# Patient Record
Sex: Female | Born: 1935 | Race: White | Hispanic: No | Smoking: Former smoker
Health system: Southern US, Community
[De-identification: ages and names within clinical notes are randomized; demographics above are authoritative.]

## PROBLEM LIST (undated history)

## (undated) DIAGNOSIS — D649 Anemia, unspecified: Secondary | ICD-10-CM

## (undated) DIAGNOSIS — N189 Chronic kidney disease, unspecified: Secondary | ICD-10-CM

## (undated) DIAGNOSIS — J45909 Unspecified asthma, uncomplicated: Secondary | ICD-10-CM

## (undated) DIAGNOSIS — M069 Rheumatoid arthritis, unspecified: Secondary | ICD-10-CM

## (undated) DIAGNOSIS — R011 Cardiac murmur, unspecified: Secondary | ICD-10-CM

## (undated) DIAGNOSIS — Z5189 Encounter for other specified aftercare: Secondary | ICD-10-CM

## (undated) DIAGNOSIS — H269 Unspecified cataract: Secondary | ICD-10-CM

## (undated) DIAGNOSIS — T7840XA Allergy, unspecified, initial encounter: Secondary | ICD-10-CM

## (undated) DIAGNOSIS — H3530 Unspecified macular degeneration: Secondary | ICD-10-CM

## (undated) DIAGNOSIS — I509 Heart failure, unspecified: Secondary | ICD-10-CM

## (undated) HISTORY — DX: Unspecified cataract: H26.9

## (undated) HISTORY — PX: CATARACT EXTRACTION: SUR2

## (undated) HISTORY — DX: Cardiac murmur, unspecified: R01.1

## (undated) HISTORY — PX: APPENDECTOMY: SHX54

## (undated) HISTORY — DX: Unspecified asthma, uncomplicated: J45.909

## (undated) HISTORY — PX: SPINE SURGERY: SHX786

## (undated) HISTORY — DX: Heart failure, unspecified: I50.9

## (undated) HISTORY — DX: Chronic kidney disease, unspecified: N18.9

## (undated) HISTORY — DX: Anemia, unspecified: D64.9

## (undated) HISTORY — DX: Encounter for other specified aftercare: Z51.89

## (undated) HISTORY — DX: Unspecified macular degeneration: H35.30

## (undated) HISTORY — PX: ABDOMINAL HYSTERECTOMY: SHX81

## (undated) HISTORY — DX: Allergy, unspecified, initial encounter: T78.40XA

## (undated) HISTORY — PX: HIP SURGERY: SHX245

## (undated) HISTORY — PX: TONSILLECTOMY: SUR1361

---

## 2016-10-03 ENCOUNTER — Ambulatory Visit (INDEPENDENT_AMBULATORY_CARE_PROVIDER_SITE_OTHER): Payer: Medicare Other | Admitting: Physician Assistant

## 2016-10-03 ENCOUNTER — Encounter: Payer: Self-pay | Admitting: Physician Assistant

## 2016-10-03 VITALS — BP 137/74 | HR 60 | Temp 97.8°F | Resp 16 | Wt 161.0 lb

## 2016-10-03 DIAGNOSIS — M79605 Pain in left leg: Secondary | ICD-10-CM | POA: Diagnosis not present

## 2016-10-03 DIAGNOSIS — L03116 Cellulitis of left lower limb: Secondary | ICD-10-CM | POA: Diagnosis not present

## 2016-10-03 DIAGNOSIS — I998 Other disorder of circulatory system: Secondary | ICD-10-CM | POA: Diagnosis not present

## 2016-10-03 MED ORDER — DOXYCYCLINE HYCLATE 100 MG PO TABS: 100.0000 mg | ORAL_TABLET | Freq: Two times a day (BID) | ORAL | 0 refills | Status: DC

## 2016-10-03 NOTE — Patient Instructions (Addendum)
Keep legs elevated 3-4 times daily for 30-45 minutes.  Keep clean and dry. Use mild soap and water.  Do not apply compression socks until infection resolves.  Come back and see me in 4 days.   Thank you for coming in today. I hope you feel we met your needs.  Feel free to call PCP if you have any questions or further requests.  Please consider signing up for MyChart if you do not already have it, as this is a great way to communicate with me.  Best,  Whitney McVey, PA-C  IF you received an x-ray today, you will receive an invoice from [facility]. Please contact [facility] at [phone] with questions or concerns regarding your invoice.   IF you received labwork today, you will receive an invoice from [place]. Please contact [facility] at [phone] with questions or concerns regarding your invoice.   Our billing staff will not be able to assist you with questions regarding bills from these companies.  You will be contacted with the lab results as soon as they are available. The fastest way to get your results is to activate your My Chart account. Instructions are located on the last page of this paperwork. If you have not heard from [place] regarding the results in 2 weeks, please contact this office.

## 2016-10-03 NOTE — Progress Notes (Signed)
   Sheila Barnes  MRN: [id number] DOB: 01/13/1928  PCP: Patient, No Pcp Per  Subjective:  Pt is an 83 year old female PMH opiod dependency, kidney disease, asthma, CHF, MI, arthritis, anemia who presents to clinic for lower leg cramping x 1 day. She is here today with her daughter. Pt lives in assisted living facility.  This is not a new problem. She has been battling with swollen LES and skin problems for years. She has been treated before for cellulitis of the LES.  She endorses compliance with elevating her LES however her daughter disagrees.   Review of Systems  Constitutional: Negative for chills, diaphoresis, fatigue and fever.  Respiratory: Negative for cough and shortness of breath.   Cardiovascular: Negative for chest pain and palpitations.    There are no active problems to display for this patient.   No current outpatient prescriptions on file prior to visit.   No current facility-administered medications on file prior to visit.     Allergies  Allergen Reactions  . Nsaids     Pt can't have   . Prednisone     Blood infection     Objective:  BP 137/74   Pulse 60   Temp 97.8 F (36.6 C) (Oral)   Resp 16   Wt 161 lb (73 kg) Comment: per pt, weighed at [facility]  SpO2 95%   Physical Exam  Constitutional: She is oriented to person, place, and time and well-developed, well-nourished, and in no distress. No distress.  She is in a wheelchair  Cardiovascular: Normal rate, regular rhythm and normal heart sounds.   Neurological: She is alert and oriented to person, place, and time. GCS score is 15.  Skin: Skin is warm and dry.  Mild warmth and erythema noted anterior left lower leg. No streaking. No abscess. 2x1 cm ulceration lateral left LES, which is weeping. Culture taken. Distal pulses present b/l. Diffuse skin breakdown superficial dermis b/l ankles and feet. No ulcerations noted between toes.   Psychiatric: Mood, memory, affect and judgment normal.    Vitals reviewed.        Assessment and Plan :  1. Cellulitis of left lower extremity 2. Pain of left lower extremity 3. Vascular insufficiency of extremity - WOUND CULTURE - doxycycline (VIBRA-TABS) 100 MG tablet; Take 1 tablet (100 mg total) by mouth 2 (two) times daily.  Dispense: 20 tablet; Refill: 0 - Culture is pending. RTC in 4 days for recheck.  - Pt has chronic vascular insufficiency with acute infection. H/o noncompliance with compression socks and elevation of LES.  Plan to cover for MRSA due to weeping lesion of left LES. Advised pt to elevate her legs several times a day. RTC in 4 days for recheck.   Marco Collie, PA-C  Primary Care at [facility] 10/03/2016 10:31 AM

## 2016-10-05 LAB — WOUND CULTURE

## 2016-10-07 ENCOUNTER — Encounter: Payer: Self-pay | Admitting: Physician Assistant

## 2016-10-07 ENCOUNTER — Ambulatory Visit (INDEPENDENT_AMBULATORY_CARE_PROVIDER_SITE_OTHER): Payer: Medicare Other | Admitting: Physician Assistant

## 2016-10-07 VITALS — BP 148/69 | HR 50 | Temp 97.5°F | Resp 16

## 2016-10-07 DIAGNOSIS — Z5189 Encounter for other specified aftercare: Secondary | ICD-10-CM

## 2016-10-07 DIAGNOSIS — L03116 Cellulitis of left lower limb: Secondary | ICD-10-CM | POA: Diagnosis not present

## 2016-10-07 MED ORDER — DOUBLE ANTIBIOTIC 500-10000 UNIT/GM EX OINT: 1.0000 "application " | TOPICAL_OINTMENT | Freq: Two times a day (BID) | CUTANEOUS | 0 refills | Status: DC

## 2016-10-07 NOTE — Progress Notes (Signed)
Sheila Barnes  MRN: [id number] DOB: Apr 22, 1927  PCP: Patient, No Pcp Per  Subjective:  Pt is an 83 year old female PMH opiod dependency, kidney disease, asthma, CHF, MI, arthritis, anemia  Who presents to clinic for follow-up leg problems. She is here today with her daughter.  She was here for this problem on 9/18. Treated with Doxycycline. Reports medication compliance, denies side effects. Wound culture shows Staph aureus growth.  Today she reports improvement of symptoms. She has been elevating her legs daily. She lives in an assisted living facility. They are applying daily dressing changes. Wound care will come and see her in 2 days.  Denies fever, chills, n/v, swelling.  This is not a new problem. She has been battling with swollen LES and skin problems for years. She has been treated before for cellulitis of the LES.   Review of Systems  Constitutional: Negative for chills, diaphoresis and fever.  Cardiovascular: Positive for leg swelling.  Musculoskeletal: Negative for arthralgias and joint swelling.  Skin: Positive for wound.    There are no active problems to display for this patient.   Current Outpatient Prescriptions on File Prior to Visit  Medication Sig Dispense Refill  . acetaminophen (TYLENOL) 325 MG tablet Take 650 mg by mouth every 6 (six) hours as needed.    Marland Kitchen aspirin 81 MG tablet Take 81 mg by mouth daily.    . Cholecalciferol (VITAMIN D3) 1000 units CAPS Take by mouth.    . clopidogrel (PLAVIX) 75 MG tablet Take 75 mg by mouth daily.    . Cyanocobalamin (VITAMIN B 12 PO) Take by mouth.    . diclofenac sodium (VOLTAREN) 1 % GEL Apply topically 4 (four) times daily.    . DiphenhydrAMINE HCl (GERI-DRYL PO) Take by mouth.    . docusate sodium (COLACE) 250 MG capsule Take 250 mg by mouth daily.    Marland Kitchen donepezil (ARICEPT) 10 MG tablet Take 10 mg by mouth at bedtime.    Marland Kitchen doxycycline (VIBRA-TABS) 100 MG tablet Take 1 tablet (100 mg total) by mouth 2 (two) times  daily. 20 tablet 0  . ferrous sulfate 325 (65 FE) MG tablet Take 325 mg by mouth daily with breakfast.    . furosemide (LASIX) 40 MG tablet Take 40 mg by mouth.    . gabapentin (NEURONTIN) 100 MG capsule Take 100 mg by mouth 3 (three) times daily.    . isosorbide dinitrate (ISORDIL) 30 MG tablet Take 30 mg by mouth 4 (four) times daily.    Marland Kitchen levothyroxine (SYNTHROID, LEVOTHROID) 75 MCG tablet Take 75 mcg by mouth daily before breakfast.    . lisinopril (PRINIVIL,ZESTRIL) 2.5 MG tablet Take 2.5 mg by mouth daily.    Marland Kitchen loratadine (CLARITIN) 10 MG tablet Take 10 mg by mouth daily.    Marland Kitchen lovastatin (ALTOPREV) 40 MG 24 hr tablet Take 40 mg by mouth at bedtime.    . metoprolol succinate (TOPROL-XL) 25 MG 24 hr tablet Take 25 mg by mouth daily.    . Multiple Vitamins-Minerals (ICAPS MV PO) Take by mouth.    . nitroGLYCERIN (NITROSTAT) 0.4 MG SL tablet Place 0.4 mg under the tongue every 5 (five) minutes as needed for chest pain.    Marland Kitchen nystatin [facility]) powder Apply topically 4 (four) times daily.    . pantoprazole (PROTONIX) 40 MG tablet Take 40 mg by mouth daily.    . Polyethylene Glycol 3350-GRX POWD Take by mouth.    . Polyvinyl Alcohol-Povidone (REFRESH OP) Apply to  eye.    . traMADol (ULTRAM) 50 MG tablet Take by mouth every 6 (six) hours as needed.    . Venlafaxine HCl 150 MG TB24 Take by mouth.     No current facility-administered medications on file prior to visit.     Allergies  Allergen Reactions  . Nsaids     Pt can't have   . Prednisone     Blood infection     Objective:  BP (!) 148/69   Pulse (!) 50   Temp (!) 97.5 F (36.4 C) (Oral)   Resp 16   SpO2 90%   Physical Exam  Constitutional: She is oriented to person, place, and time and well-developed, well-nourished, and in no distress. No distress.  Wheelchair bound  Cardiovascular: Normal rate, regular rhythm and normal heart sounds.   Musculoskeletal:       Left ankle: She exhibits no swelling.       Left lower leg:  She exhibits no edema.  Neurological: She is alert and oriented to person, place, and time. GCS score is 15.  Skin: Skin is warm and dry.  Wound is healing. Improvement of cellulitis and edema. Not warm to touch. She is still moderately TTP. No drainage. Distal pulses present.   Psychiatric: Mood, memory, affect and judgment normal.  Vitals reviewed.      Assessment and Plan :  1. Cellulitis of left lower extremity 2. Encounter for wound re-check - polymixin-bacitracin (POLYSPORIN) [id number] UNIT/GM OINT ointment; Apply 1 application topically 2 (two) times daily.  Dispense: 1 Tube; Refill: 0 - Pt is improving. Wound culture shows staph aureus. Con't antibiotics and dressing changes. Wound care discussed with pt, however she has wound care team coming to see her in 2 days. RTC if symptoms worsen/do not improve.   Marco Collie, PA-C  Primary Care at [facility] 10/07/2016 11:03 AM

## 2016-10-07 NOTE — Patient Instructions (Addendum)
Continue your antibiotics until course completion.  Polysporin ointment. Apply to lowe left leg lesion twice daily. Keep wound clean and dry. Cover with nonadherent dressing until resolution of wound.  RTC if symptoms worsen.   Thank you for coming in today. I hope you feel we met your needs.  Feel free to call PCP if you have any questions or further requests.  Please consider signing up for MyChart if you do not already have it, as this is a great way to communicate with me.  Best,  Whitney McVey, PA-C  IF you received an x-ray today, you will receive an invoice from [facility]. Please contact [facility] at [phone] with questions or concerns regarding your invoice.   IF you received labwork today, you will receive an invoice from [place]. Please contact [facility] at [phone] with questions or concerns regarding your invoice.   Our billing staff will not be able to assist you with questions regarding bills from these companies.  You will be contacted with the lab results as soon as they are available. The fastest way to get your results is to activate your My Chart account. Instructions are located on the last page of this paperwork. If you have not heard from [place] regarding the results in 2 weeks, please contact this office.

## 2016-12-17 ENCOUNTER — Encounter (HOSPITAL_COMMUNITY): Payer: Self-pay

## 2016-12-17 ENCOUNTER — Emergency Department (HOSPITAL_COMMUNITY): Payer: Medicare Other

## 2016-12-17 ENCOUNTER — Inpatient Hospital Stay (HOSPITAL_COMMUNITY)
Admission: EM | Admit: 2016-12-17 | Discharge: 2016-12-20 | DRG: 603 | Disposition: A | Discharge status: Nursing Home (Includes ICF) | Payer: Medicare Other | Attending: Internal Medicine | Admitting: Internal Medicine

## 2016-12-17 DIAGNOSIS — R778 Other specified abnormalities of plasma proteins: Secondary | ICD-10-CM | POA: Diagnosis not present

## 2016-12-17 DIAGNOSIS — E785 Hyperlipidemia, unspecified: Secondary | ICD-10-CM | POA: Diagnosis present

## 2016-12-17 DIAGNOSIS — R609 Edema, unspecified: Secondary | ICD-10-CM

## 2016-12-17 DIAGNOSIS — Z7982 Long term (current) use of aspirin: Secondary | ICD-10-CM

## 2016-12-17 DIAGNOSIS — H3530 Unspecified macular degeneration: Secondary | ICD-10-CM | POA: Diagnosis present

## 2016-12-17 DIAGNOSIS — R001 Bradycardia, unspecified: Secondary | ICD-10-CM | POA: Diagnosis not present

## 2016-12-17 DIAGNOSIS — M7989 Other specified soft tissue disorders: Secondary | ICD-10-CM

## 2016-12-17 DIAGNOSIS — N189 Chronic kidney disease, unspecified: Secondary | ICD-10-CM | POA: Diagnosis present

## 2016-12-17 DIAGNOSIS — I509 Heart failure, unspecified: Secondary | ICD-10-CM | POA: Diagnosis present

## 2016-12-17 DIAGNOSIS — Z7902 Long term (current) use of antithrombotics/antiplatelets: Secondary | ICD-10-CM

## 2016-12-17 DIAGNOSIS — Z87891 Personal history of nicotine dependence: Secondary | ICD-10-CM

## 2016-12-17 DIAGNOSIS — E875 Hyperkalemia: Secondary | ICD-10-CM

## 2016-12-17 DIAGNOSIS — M1711 Unilateral primary osteoarthritis, right knee: Secondary | ICD-10-CM | POA: Diagnosis present

## 2016-12-17 DIAGNOSIS — L03116 Cellulitis of left lower limb: Principal | ICD-10-CM | POA: Diagnosis present

## 2016-12-17 DIAGNOSIS — L0390 Cellulitis, unspecified: Secondary | ICD-10-CM | POA: Diagnosis present

## 2016-12-17 DIAGNOSIS — J45909 Unspecified asthma, uncomplicated: Secondary | ICD-10-CM | POA: Diagnosis present

## 2016-12-17 DIAGNOSIS — Z9889 Other specified postprocedural states: Secondary | ICD-10-CM

## 2016-12-17 DIAGNOSIS — Z79899 Other long term (current) drug therapy: Secondary | ICD-10-CM

## 2016-12-17 DIAGNOSIS — E039 Hypothyroidism, unspecified: Secondary | ICD-10-CM | POA: Diagnosis present

## 2016-12-17 HISTORY — DX: Rheumatoid arthritis, unspecified: M06.9

## 2016-12-17 LAB — CBC WITH DIFFERENTIAL/PLATELET
Basophils Absolute: 0 10*3/uL (ref 0.0–0.1)
Basophils Relative: 0 %
Eosinophils Absolute: 0.4 10*3/uL (ref 0.0–0.7)
Eosinophils Relative: 4 %
HEMATOCRIT: 35 % — AB (ref 36.0–46.0)
HEMOGLOBIN: 11 g/dL — AB (ref 12.0–15.0)
LYMPHS ABS: 1.7 10*3/uL (ref 0.7–4.0)
LYMPHS PCT: 18 %
MCH: 30.8 pg (ref 26.0–34.0)
MCHC: 31.4 g/dL (ref 30.0–36.0)
MCV: 98 fL (ref 78.0–100.0)
MONO ABS: 1.1 10*3/uL — AB (ref 0.1–1.0)
MONOS PCT: 11 %
NEUTROS ABS: 6.3 10*3/uL (ref 1.7–7.7)
Neutrophils Relative %: 67 %
Platelets: 172 10*3/uL (ref 150–400)
RBC: 3.57 MIL/uL — ABNORMAL LOW (ref 3.87–5.11)
RDW: 18.6 % — AB (ref 11.5–15.5)
WBC: 9.5 10*3/uL (ref 4.0–10.5)

## 2016-12-17 LAB — URINALYSIS, ROUTINE W REFLEX MICROSCOPIC
BACTERIA UA: NONE SEEN
Bilirubin Urine: NEGATIVE
Glucose, UA: NEGATIVE mg/dL
Hgb urine dipstick: NEGATIVE
Ketones, ur: NEGATIVE mg/dL
Nitrite: NEGATIVE
PH: 5 (ref 5.0–8.0)
Protein, ur: NEGATIVE mg/dL
SPECIFIC GRAVITY, URINE: 1.013 (ref 1.005–1.030)

## 2016-12-17 LAB — COMPREHENSIVE METABOLIC PANEL
ALBUMIN: 3.3 g/dL — AB (ref 3.5–5.0)
ALK PHOS: 83 U/L (ref 38–126)
ALT: 33 U/L (ref 14–54)
ANION GAP: 9 (ref 5–15)
AST: 31 U/L (ref 15–41)
BUN: 41 mg/dL — ABNORMAL HIGH (ref 6–20)
CALCIUM: 9.1 mg/dL (ref 8.9–10.3)
CHLORIDE: 106 mmol/L (ref 101–111)
CO2: 24 mmol/L (ref 22–32)
Creatinine, Ser: 2.05 mg/dL — ABNORMAL HIGH (ref 0.44–1.00)
GFR calc Af Amer: 24 mL/min — ABNORMAL LOW (ref >60)
GFR calc non Af Amer: 21 mL/min — ABNORMAL LOW (ref >60)
GLUCOSE: 87 mg/dL (ref 65–99)
POTASSIUM: 5.4 mmol/L — AB (ref 3.5–5.1)
SODIUM: 139 mmol/L (ref 135–145)
Total Bilirubin: 0.4 mg/dL (ref 0.3–1.2)
Total Protein: 7.5 g/dL (ref 6.5–8.1)

## 2016-12-17 LAB — I-STAT CG4 LACTIC ACID, ED: LACTIC ACID, VENOUS: 1.69 mmol/L (ref 0.5–1.9)

## 2016-12-17 MED ORDER — TRAMADOL HCL 50 MG PO TABS
50.0000 mg | ORAL_TABLET | Freq: Once | ORAL | Status: AC
  Administered 2016-12-17: 50 mg via ORAL
  Filled 2016-12-17: qty 1

## 2016-12-17 NOTE — ED Provider Notes (Signed)
[facility] EMERGENCY DEPARTMENT Provider Note   CSN: [id number] Arrival date & time: 12/17/16  1636     History   Chief Complaint No chief complaint on file.   HPI Sheila Barnes is a 83 y.o. female.  HPI  This is an 83 year old female with a history of chronic kidney disease, atrial fibrillation, heart failure, asthma who presents with lower extremity pain and swelling.  Patient's daughter provides most of the history.  She has a history of recurrent cellulitis requiring a prolonged course of doxycycline.  This was back in September and October.  She was seen again by her physician at her living facility and has been treated for a wound on her left lower extremity by home health.  The daughter states that today she noted increased swelling bilaterally and bruising of the right lower extremity.  She was seen at urgent care and referred here.  Denies any fevers.  Does report 10 out of 10 pain.  Takes tramadol at baseline.  Patient reportedly fell out of her wheelchair 3 days ago.  She is unable to provide additional history regarding the fall.  Daughters have noted weeping lower extremity edema which is new.  They have also noted blistering of the lower extremities.  Patient denies any chest pain.  She also denies shortness of breath but states "I am wheezing."  No fevers.  She is not currently on any antibiotics.  She does take aspirin and Plavix.  At baseline, patient is in a wheelchair and does transfers.  Past Medical History:  Diagnosis Date  . Allergy   . Anemia   . Arthritis   . Asthma   . Blood transfusion without reported diagnosis   . Cataract   . CHF (congestive heart failure) (HCC)   . Chronic kidney disease   . Heart murmur   . Macular degeneration     Patient Active Problem List   Diagnosis Date Noted  . Cellulitis 12/18/2016    Past Surgical History:  Procedure Laterality Date  . ABDOMINAL HYSTERECTOMY    . SPINE SURGERY      OB History     No data available       Home Medications    Prior to Admission medications   Medication Sig Start Date End Date Taking? Authorizing Provider  acetaminophen (TYLENOL) 325 MG tablet Take 650 mg by mouth at bedtime.    Yes [provider]  acetaminophen (TYLENOL) 325 MG tablet Take 650 mg by mouth every 6 (six) hours as needed for mild pain, moderate pain or fever.   Yes [provider]  alum & mag hydroxide-simeth (GERI-LANTA) 200-200-20 MG/5ML suspension Take 30 mLs by mouth every 6 (six) hours as needed for indigestion or heartburn.   Yes [provider]  aspirin 81 MG tablet Take 81 mg by mouth daily.   Yes [provider]  carboxymethylcellulose (REFRESH TEARS) 0.5 % SOLN Place 1 drop into the left eye every 6 (six) hours as needed (for dry eyes).   Yes [provider]  carboxymethylcellulose (REFRESH TEARS) 0.5 % SOLN Place 1 drop into both eyes daily as needed (for dry eyes).   Yes [provider]  cholecalciferol (VITAMIN D) 1000 units tablet Take 1,000 Units by mouth daily.   Yes [provider]  clopidogrel (PLAVIX) 75 MG tablet Take 75 mg by mouth daily.   Yes [provider]  diclofenac sodium (VOLTAREN) 1 % GEL Apply 4 g topically 3 (three) times  daily.    Yes [provider]  docusate sodium (COLACE) 250 MG capsule Take 250 mg by mouth daily as needed for constipation.    Yes [provider]  donepezil (ARICEPT) 10 MG tablet Take 10 mg by mouth at bedtime.   Yes [provider]  ferrous sulfate 325 (65 FE) MG tablet Take 325 mg by mouth 2 (two) times daily with a meal.    Yes [provider]  furosemide (LASIX) 40 MG tablet Take 20 mg by mouth daily.    Yes [provider]  gabapentin (NEURONTIN) 100 MG capsule Take 200 mg by mouth 3 (three) times daily.    Yes [provider]  isosorbide mononitrate (IMDUR) 30 MG 24 hr tablet Take 30 mg by mouth daily.    Yes [provider]  levothyroxine (SYNTHROID, LEVOTHROID) 75 MCG tablet Take 75 mcg by mouth daily before breakfast.   Yes [provider]  lisinopril (PRINIVIL,ZESTRIL) 2.5 MG tablet Take 2.5 mg by mouth daily.   Yes [provider]  loperamide (IMODIUM A-D) 2 MG tablet Take 2 mg by mouth 4 (four) times daily as needed for diarrhea or loose stools.   Yes [provider]  loratadine (CLARITIN) 10 MG tablet Take 10 mg by mouth daily.   Yes [provider]  lovastatin (ALTOPREV) 40 MG 24 hr tablet Take 40 mg by mouth at bedtime.   Yes [provider]  magnesium hydroxide (MILK OF MAGNESIA) 400 MG/5ML suspension Take 30 mLs by mouth daily as needed for mild constipation.   Yes [provider]  metoprolol tartrate (LOPRESSOR) 25 MG tablet Take 25 mg by mouth 2 (two) times daily.   Yes [provider]  Multiple Vitamins-Minerals (ICAPS MV PO) Take 1 capsule by mouth daily.    Yes [provider]  nitroGLYCERIN (NITROSTAT) 0.4 MG SL tablet Place 0.4 mg under the tongue every 5 (five) minutes as needed for chest pain.   Yes [provider]  nystatin (MYCOSTATIN/NYSTOP) powder Apply topically daily as needed (to rash).   Yes [provider]  pantoprazole (PROTONIX) 40 MG tablet Take 40 mg by mouth daily.   Yes [provider]  polyethylene glycol (MIRALAX / GLYCOLAX) packet Take 17 g by mouth daily as needed for mild constipation.   Yes [provider]  PROAIR HFA 108 (90 Base) MCG/ACT inhaler Inhale 2 puffs into the lungs 2 (two) times daily.  12/13/16  Yes [provider]  traMADol (ULTRAM) 50 MG tablet Take 50 mg by mouth 2 (two) times daily as needed for moderate pain.    Yes [provider]  venlafaxine XR (EFFEXOR-XR) 150 MG 24 hr capsule Take 150 mg by mouth daily with breakfast.   Yes [provider]  vitamin B-12 (CYANOCOBALAMIN) 1000 MCG tablet Take 1,000  mcg by mouth daily.   Yes [provider]  doxycycline (VIBRA-TABS) 100 MG tablet Take 1 tablet (100 mg total) by mouth 2 (two) times daily. Patient not taking: Reported on 12/18/2016 10/03/16   McVey, Madelaine BhatElizabeth Whitney, PA-C  metoprolol succinate (TOPROL-XL) 25 MG 24 hr tablet Take 25 mg by mouth daily.    [provider]  polymixin-bacitracin (POLYSPORIN) 500-10000 UNIT/GM OINT ointment Apply 1 application topically 2 (two) times daily. Patient not taking: Reported on 12/18/2016 10/07/16   McVey, Madelaine BhatElizabeth Whitney, PA-C    Family History History reviewed. No pertinent family history.  Social History Social History   Tobacco Use  . Smoking  status: Former Games developer  . Smokeless tobacco: Never Used  Substance Use Topics  . Alcohol use: No  . Drug use: No     Allergies   Nsaids and Prednisone   Review of Systems Review of Systems  Constitutional: Negative for fever.  Respiratory: Positive for wheezing. Negative for shortness of breath.   Cardiovascular: Negative for chest pain.  Gastrointestinal: Negative for abdominal pain.  Genitourinary: Negative for dysuria.  Musculoskeletal: Positive for back pain.  Skin: Positive for color change and wound.  Neurological: Negative for weakness.  All other systems reviewed and are negative.    Physical Exam Updated Vital Signs BP (!) 146/63   Pulse (!) 49   Temp 97.9 F (36.6 C) (Oral)   Resp 17   Ht 5\' 6"  (1.676 m)   SpO2 97%   BMI 25.99 kg/m   Physical Exam  Constitutional: She is oriented to person, place, and time. No distress.  Elderly, uncomfortable appearing, no acute distress  HENT:  Head: Normocephalic and atraumatic.  Cardiovascular: Normal rate and regular rhythm.  Murmur heard. Pulmonary/Chest: Effort normal. No respiratory distress. She has wheezes.  Faint expiratory wheezing mostly in bilateral upper lung fields  Abdominal: Soft. Bowel sounds are normal. There is no tenderness.    Musculoskeletal:  Weeping 2+ pitting bilateral lower extremity edema right greater than left, additionally patient has blistering over the right lower extremity inferior to the knee, extensive bruising about the knee circumferentially, right foot is cool to touch but sensation is intact, difficult to palpate DP pulse bilaterally Left lower extremity with similar edema, single ulcerated wound approximately 2 cm in diameter over the lateral aspect of the calf, no adjacent erythema or significant warmth  Neurological: She is alert and oriented to person, place, and time.  Skin: Skin is warm and dry.  Psychiatric: She has a normal mood and affect.  Nursing note and vitals reviewed.      ED Treatments / Results  Labs (all labs ordered are listed, but only abnormal results are displayed) Labs Reviewed  COMPREHENSIVE METABOLIC PANEL - Abnormal; Notable for the following components:      Result Value   Potassium 5.4 (*)    BUN 41 (*)    Creatinine, Ser 2.05 (*)    Albumin 3.3 (*)    GFR calc non Af Amer 21 (*)    GFR calc Af Amer 24 (*)    All other components within normal limits  CBC WITH DIFFERENTIAL/PLATELET - Abnormal; Notable for the following components:   RBC 3.57 (*)    Hemoglobin 11.0 (*)    HCT 35.0 (*)    RDW 18.6 (*)    Monocytes Absolute 1.1 (*)    All other components within normal limits  URINALYSIS, ROUTINE W REFLEX MICROSCOPIC - Abnormal; Notable for the following components:   APPearance HAZY (*)    Leukocytes, UA TRACE (*)    Squamous Epithelial / LPF 0-5 (*)    All other components within normal limits  BRAIN NATRIURETIC PEPTIDE - Abnormal; Notable for the following components:   B Natriuretic Peptide 593.8 (*)    All other components within normal limits  DIGOXIN LEVEL  I-STAT CG4 LACTIC ACID, ED    EKG  EKG Interpretation  Date/Time:  Monday December 18 2016 00:28:28 EST Ventricular Rate:  56 PR Interval:    QRS Duration: 108 QT Interval:  446 QTC  Calculation: 431 R Axis:   108 Text Interpretation:  Atrial fibrillation Multiple ventricular premature complexes  Low voltage, extremity and precordial leads Borderline repolarization abnormality Confirmed by Ross Marcus [phone]) on 12/18/2016 12:31:27 AM       Radiology Dg Chest 2 View  Result Date: 12/18/2016 CLINICAL DATA:  83 year old female with fall and right lower extremity pain. EXAM: CHEST  2 VIEW COMPARISON:  None. FINDINGS: There is mild cardiomegaly. There is atherosclerotic calcification of the aortic arch. There is no focal consolidation, pleural effusion, or pneumothorax. Degenerative changes of the shoulders an spine. Age indeterminate, possibly chronic midthoracic compression deformity with anterior wedging. Correlation with clinical exam and point tenderness recommended. IMPRESSION: 1. No acute cardiopulmonary process. 2. Age indeterminate compression deformity of the midthoracic vertebra, possibly chronic. Correlation with point tenderness recommended. Electronically Signed   By: Elgie Collard M.D.   On: 12/18/2016 00:30   Dg Tibia/fibula Right  Result Date: 12/18/2016 CLINICAL DATA:  83 year old female with fall and right lower extremity pain. EXAM: RIGHT TIBIA AND FIBULA - 2 VIEW COMPARISON:  None. FINDINGS: There is severe osteoarthritic changes of the knee with narrowing of the medial and lateral compartments and bone spurring. There is sclerotic changes of the medial tibial plateau. An age indeterminate minimally depressed fracture of the medial tibial plateau is not excluded. Correlation with clinical exam and point tenderness recommended. MRI may provide better evaluation if clinically indicated. No other acute fracture identified. There is no dislocation. The bones are osteopenic. There is mild soft tissue swelling anterior to the proximal tibia. There is atherosclerotic calcification of the vasculature. Mild diffuse subcutaneous edema. IMPRESSION: Severe osteoarthritic  changes of the knee with narrowing of the medial and lateral compartment and possible associated age indeterminate compression of the medial tibial plateau. MRI may provide better evaluation if clinically indicated. No other acute fracture identified. No dislocation. Electronically Signed   By: Elgie Collard M.D.   On: 12/18/2016 00:38    Procedures Procedures (including critical care time)  Medications Ordered in ED Medications  traMADol (ULTRAM) tablet 50 mg (50 mg Oral Given 12/17/16 2340)  furosemide (LASIX) injection 40 mg (40 mg Intravenous Given 12/18/16 0135)     Initial Impression / Assessment and Plan / ED Course  I have reviewed the triage vital signs and the nursing notes.  Pertinent labs & imaging results that were available during my care of the patient were reviewed by me and considered in my medical decision making (see chart for details).     Patient presents with lower extremity weeping and swelling.  She also has evidence of trauma to the right lower leg.  There is no acute indication of infection.  Suspect heart failure in combination with trauma.  DVT is also a consideration.  Patient is otherwise hemodynamically stable.  She is atrial fibrillation on her EKG.  This is not new.  Lab work obtained.  Potassium mildly elevated at 5.4 with a creatinine of 2.  Known history of kidney disease.  Given extent of edema, patient was given 40 mg IV Lasix.  X-ray is equivocal for acute fracture.  It is unclear whether any management would be undertaken if she did have an acute tibial plateau fracture given that she only transfers; however, it may dictate need for more intensive physical therapy.  MRI ordered to evaluate for acute fracture.  Patient also likely needs a DVT study as she is at high risk given trauma and for DVT.  Given overall clinical picture, feel patient would benefit from observation admission for further testing as well as diuresis and physical therapy.  Discussed  with Dr. Selena BattenKim.   Final Clinical Impressions(s) / ED Diagnoses   Final diagnoses:  Acute on chronic congestive heart failure, unspecified heart failure type (HCC)  Swelling of right lower extremity    ED Discharge Orders    None       Shon BatonHorton, Courtney F, MD 12/18/16 [id number]

## 2016-12-17 NOTE — ED Notes (Signed)
Patient transported to X-ray 

## 2016-12-17 NOTE — ED Triage Notes (Signed)
Patient here with daughters who reports recurrent lower leg redness with weeping. Daughter states that she has been treated for cellulitis in past and thinks its the same, no fever, no injury

## 2016-12-18 ENCOUNTER — Inpatient Hospital Stay (HOSPITAL_COMMUNITY): Payer: Medicare Other

## 2016-12-18 ENCOUNTER — Other Ambulatory Visit: Payer: Self-pay

## 2016-12-18 ENCOUNTER — Encounter (HOSPITAL_COMMUNITY): Payer: Self-pay | Admitting: Internal Medicine

## 2016-12-18 DIAGNOSIS — R609 Edema, unspecified: Secondary | ICD-10-CM

## 2016-12-18 DIAGNOSIS — Z87891 Personal history of nicotine dependence: Secondary | ICD-10-CM | POA: Diagnosis not present

## 2016-12-18 DIAGNOSIS — E875 Hyperkalemia: Secondary | ICD-10-CM

## 2016-12-18 DIAGNOSIS — Z7902 Long term (current) use of antithrombotics/antiplatelets: Secondary | ICD-10-CM | POA: Diagnosis not present

## 2016-12-18 DIAGNOSIS — L03116 Cellulitis of left lower limb: Secondary | ICD-10-CM | POA: Diagnosis present

## 2016-12-18 DIAGNOSIS — I361 Nonrheumatic tricuspid (valve) insufficiency: Secondary | ICD-10-CM | POA: Diagnosis not present

## 2016-12-18 DIAGNOSIS — R778 Other specified abnormalities of plasma proteins: Secondary | ICD-10-CM | POA: Diagnosis not present

## 2016-12-18 DIAGNOSIS — L03119 Cellulitis of unspecified part of limb: Secondary | ICD-10-CM

## 2016-12-18 DIAGNOSIS — Z7982 Long term (current) use of aspirin: Secondary | ICD-10-CM | POA: Diagnosis not present

## 2016-12-18 DIAGNOSIS — J45909 Unspecified asthma, uncomplicated: Secondary | ICD-10-CM | POA: Diagnosis present

## 2016-12-18 DIAGNOSIS — M7989 Other specified soft tissue disorders: Secondary | ICD-10-CM | POA: Diagnosis present

## 2016-12-18 DIAGNOSIS — L03115 Cellulitis of right lower limb: Secondary | ICD-10-CM | POA: Diagnosis not present

## 2016-12-18 DIAGNOSIS — I509 Heart failure, unspecified: Secondary | ICD-10-CM | POA: Diagnosis present

## 2016-12-18 DIAGNOSIS — N189 Chronic kidney disease, unspecified: Secondary | ICD-10-CM

## 2016-12-18 DIAGNOSIS — H3530 Unspecified macular degeneration: Secondary | ICD-10-CM | POA: Diagnosis present

## 2016-12-18 DIAGNOSIS — Z79899 Other long term (current) drug therapy: Secondary | ICD-10-CM | POA: Diagnosis not present

## 2016-12-18 DIAGNOSIS — E039 Hypothyroidism, unspecified: Secondary | ICD-10-CM | POA: Diagnosis present

## 2016-12-18 DIAGNOSIS — S8011XA Contusion of right lower leg, initial encounter: Secondary | ICD-10-CM | POA: Diagnosis not present

## 2016-12-18 DIAGNOSIS — M1711 Unilateral primary osteoarthritis, right knee: Secondary | ICD-10-CM | POA: Diagnosis present

## 2016-12-18 DIAGNOSIS — L0390 Cellulitis, unspecified: Secondary | ICD-10-CM | POA: Diagnosis present

## 2016-12-18 DIAGNOSIS — R001 Bradycardia, unspecified: Secondary | ICD-10-CM | POA: Diagnosis not present

## 2016-12-18 DIAGNOSIS — E785 Hyperlipidemia, unspecified: Secondary | ICD-10-CM | POA: Diagnosis present

## 2016-12-18 LAB — TROPONIN I
TROPONIN I: 0.03 ng/mL — AB (ref <0.03)
Troponin I: 0.04 ng/mL (ref <0.03)
Troponin I: 0.09 ng/mL (ref <0.03)

## 2016-12-18 LAB — COMPREHENSIVE METABOLIC PANEL
ALT: 20 U/L (ref 14–54)
AST: 40 U/L (ref 15–41)
Albumin: 3.1 g/dL — ABNORMAL LOW (ref 3.5–5.0)
Alkaline Phosphatase: 96 U/L (ref 38–126)
Anion gap: 10 (ref 5–15)
BUN: 40 mg/dL — AB (ref 6–20)
CHLORIDE: 106 mmol/L (ref 101–111)
CO2: 22 mmol/L (ref 22–32)
CREATININE: 1.86 mg/dL — AB (ref 0.44–1.00)
Calcium: 8.9 mg/dL (ref 8.9–10.3)
GFR calc Af Amer: 27 mL/min — ABNORMAL LOW (ref >60)
GFR calc non Af Amer: 23 mL/min — ABNORMAL LOW (ref >60)
Glucose, Bld: 97 mg/dL (ref 65–99)
Potassium: 4 mmol/L (ref 3.5–5.1)
SODIUM: 138 mmol/L (ref 135–145)
Total Bilirubin: 1.1 mg/dL (ref 0.3–1.2)
Total Protein: 7 g/dL (ref 6.5–8.1)

## 2016-12-18 LAB — CBC
HEMATOCRIT: 34.2 % — AB (ref 36.0–46.0)
Hemoglobin: 10.8 g/dL — ABNORMAL LOW (ref 12.0–15.0)
MCH: 30.3 pg (ref 26.0–34.0)
MCHC: 31.6 g/dL (ref 30.0–36.0)
MCV: 96.1 fL (ref 78.0–100.0)
Platelets: 171 10*3/uL (ref 150–400)
RBC: 3.56 MIL/uL — AB (ref 3.87–5.11)
RDW: 18.3 % — ABNORMAL HIGH (ref 11.5–15.5)
WBC: 9.2 10*3/uL (ref 4.0–10.5)

## 2016-12-18 LAB — TSH: TSH: 6.179 u[IU]/mL — AB (ref 0.350–4.500)

## 2016-12-18 LAB — BRAIN NATRIURETIC PEPTIDE: B Natriuretic Peptide: 593.8 pg/mL — ABNORMAL HIGH (ref 0.0–100.0)

## 2016-12-18 MED ORDER — SODIUM POLYSTYRENE SULFONATE 15 GM/60ML PO SUSP: 15.0000 g | Freq: Once | ORAL | Status: DC

## 2016-12-18 MED ORDER — SODIUM CHLORIDE 0.9% FLUSH: 3.0000 mL | INTRAVENOUS | Status: DC | PRN

## 2016-12-18 MED ORDER — ACETAMINOPHEN 325 MG PO TABS
650.0000 mg | ORAL_TABLET | Freq: Four times a day (QID) | ORAL | Status: DC | PRN
  Administered 2016-12-18: 650 mg via ORAL
  Filled 2016-12-18: qty 2

## 2016-12-18 MED ORDER — TRAMADOL HCL 50 MG PO TABS
50.0000 mg | ORAL_TABLET | Freq: Four times a day (QID) | ORAL | Status: DC | PRN
  Administered 2016-12-18 – 2016-12-20 (×5): 50 mg via ORAL
  Filled 2016-12-18 (×6): qty 1

## 2016-12-18 MED ORDER — LISINOPRIL 5 MG PO TABS
2.5000 mg | ORAL_TABLET | Freq: Every day | ORAL | Status: DC
  Administered 2016-12-18 – 2016-12-20 (×3): 2.5 mg via ORAL
  Filled 2016-12-18 (×3): qty 1

## 2016-12-18 MED ORDER — METOPROLOL TARTRATE 25 MG PO TABS
25.0000 mg | ORAL_TABLET | Freq: Two times a day (BID) | ORAL | Status: DC
Start: 2016-12-18 — End: 2016-12-20
  Administered 2016-12-18 – 2016-12-20 (×5): 25 mg via ORAL
  Filled 2016-12-18 (×5): qty 1

## 2016-12-18 MED ORDER — ASPIRIN 81 MG PO CHEW
81.0000 mg | CHEWABLE_TABLET | Freq: Every day | ORAL | Status: DC
  Administered 2016-12-18 – 2016-12-20 (×3): 81 mg via ORAL
  Filled 2016-12-18 (×3): qty 1

## 2016-12-18 MED ORDER — DEXTROSE 5 % IV SOLN
1.0000 g | INTRAVENOUS | Status: DC
  Administered 2016-12-18: 1 g via INTRAVENOUS
  Filled 2016-12-18: qty 10

## 2016-12-18 MED ORDER — VITAMIN B-12 1000 MCG PO TABS
1000.0000 ug | ORAL_TABLET | Freq: Every day | ORAL | Status: DC
  Administered 2016-12-18 – 2016-12-20 (×3): 1000 ug via ORAL
  Filled 2016-12-18 (×4): qty 1

## 2016-12-18 MED ORDER — DICLOFENAC SODIUM 1 % TD GEL
4.0000 g | Freq: Three times a day (TID) | TRANSDERMAL | Status: DC
  Administered 2016-12-19 – 2016-12-20 (×5): 4 g via TOPICAL
  Filled 2016-12-18 (×2): qty 100

## 2016-12-18 MED ORDER — SODIUM POLYSTYRENE SULFONATE 15 GM/60ML PO SUSP
15.0000 g | Freq: Once | ORAL | Status: AC
  Administered 2016-12-18: 15 g via ORAL
  Filled 2016-12-18: qty 60

## 2016-12-18 MED ORDER — SODIUM CHLORIDE 0.9% FLUSH
3.0000 mL | Freq: Two times a day (BID) | INTRAVENOUS | Status: DC
  Administered 2016-12-18 – 2016-12-20 (×3): 3 mL via INTRAVENOUS

## 2016-12-18 MED ORDER — SODIUM CHLORIDE 0.9 % IV BOLUS (SEPSIS)
250.0000 mL | Freq: Once | INTRAVENOUS | Status: AC
  Administered 2016-12-19: 250 mL via INTRAVENOUS

## 2016-12-18 MED ORDER — LOPERAMIDE HCL 2 MG PO CAPS: 2.0000 mg | ORAL_CAPSULE | Freq: Four times a day (QID) | ORAL | Status: DC | PRN

## 2016-12-18 MED ORDER — ACETAMINOPHEN 325 MG PO TABS
650.0000 mg | ORAL_TABLET | Freq: Every day | ORAL | Status: DC
Start: 2016-12-18 — End: 2016-12-20
  Administered 2016-12-18 – 2016-12-19 (×2): 650 mg via ORAL
  Filled 2016-12-18 (×2): qty 2

## 2016-12-18 MED ORDER — POLYETHYLENE GLYCOL 3350 17 G PO PACK: 17.0000 g | PACK | Freq: Every day | ORAL | Status: DC | PRN

## 2016-12-18 MED ORDER — LOVASTATIN 20 MG PO TABS
40.0000 mg | ORAL_TABLET | Freq: Every day | ORAL | Status: DC
  Filled 2016-12-18 (×2): qty 2

## 2016-12-18 MED ORDER — DOCUSATE SODIUM 50 MG PO CAPS: 250.0000 mg | ORAL_CAPSULE | Freq: Every day | ORAL | Status: DC | PRN

## 2016-12-18 MED ORDER — METHOCARBAMOL 500 MG PO TABS
500.0000 mg | ORAL_TABLET | Freq: Four times a day (QID) | ORAL | Status: DC | PRN
  Administered 2016-12-18 – 2016-12-20 (×4): 500 mg via ORAL
  Filled 2016-12-18 (×4): qty 1

## 2016-12-18 MED ORDER — LEVOTHYROXINE SODIUM 75 MCG PO TABS
75.0000 ug | ORAL_TABLET | Freq: Every day | ORAL | Status: DC
  Administered 2016-12-18 – 2016-12-20 (×3): 75 ug via ORAL
  Filled 2016-12-18 (×3): qty 1

## 2016-12-18 MED ORDER — FUROSEMIDE 20 MG PO TABS
20.0000 mg | ORAL_TABLET | Freq: Every day | ORAL | Status: DC
  Administered 2016-12-18 – 2016-12-20 (×3): 20 mg via ORAL
  Filled 2016-12-18 (×3): qty 1

## 2016-12-18 MED ORDER — CEFAZOLIN SODIUM-DEXTROSE 1-4 GM/50ML-% IV SOLN
1.0000 g | Freq: Two times a day (BID) | INTRAVENOUS | Status: DC
  Administered 2016-12-18 – 2016-12-20 (×5): 1 g via INTRAVENOUS
  Filled 2016-12-18 (×5): qty 50

## 2016-12-18 MED ORDER — GABAPENTIN 100 MG PO CAPS
200.0000 mg | ORAL_CAPSULE | Freq: Three times a day (TID) | ORAL | Status: DC
  Administered 2016-12-18 – 2016-12-20 (×8): 200 mg via ORAL
  Filled 2016-12-18 (×8): qty 2

## 2016-12-18 MED ORDER — ALBUTEROL SULFATE (2.5 MG/3ML) 0.083% IN NEBU
3.0000 mL | INHALATION_SOLUTION | Freq: Two times a day (BID) | RESPIRATORY_TRACT | Status: DC
  Administered 2016-12-18 – 2016-12-19 (×3): 3 mL via RESPIRATORY_TRACT
  Filled 2016-12-18 (×4): qty 3

## 2016-12-18 MED ORDER — ISOSORBIDE MONONITRATE ER 30 MG PO TB24
30.0000 mg | ORAL_TABLET | Freq: Every day | ORAL | Status: DC
  Administered 2016-12-18 – 2016-12-20 (×3): 30 mg via ORAL
  Filled 2016-12-18 (×3): qty 1

## 2016-12-18 MED ORDER — PANTOPRAZOLE SODIUM 40 MG PO TBEC
40.0000 mg | DELAYED_RELEASE_TABLET | Freq: Every day | ORAL | Status: DC
Start: 2016-12-18 — End: 2016-12-20
  Administered 2016-12-18 – 2016-12-20 (×3): 40 mg via ORAL
  Filled 2016-12-18 (×3): qty 1

## 2016-12-18 MED ORDER — SODIUM CHLORIDE 0.9 % IV SOLN: 250.0000 mL | INTRAVENOUS | Status: DC | PRN

## 2016-12-18 MED ORDER — METHOCARBAMOL 500 MG PO TABS: 1000.0000 mg | ORAL_TABLET | Freq: Four times a day (QID) | ORAL | Status: DC | PRN

## 2016-12-18 MED ORDER — VITAMIN D 1000 UNITS PO TABS
1000.0000 [IU] | ORAL_TABLET | Freq: Every day | ORAL | Status: DC
  Administered 2016-12-18 – 2016-12-20 (×3): 1000 [IU] via ORAL
  Filled 2016-12-18 (×4): qty 1

## 2016-12-18 MED ORDER — LORATADINE 10 MG PO TABS
10.0000 mg | ORAL_TABLET | Freq: Every day | ORAL | Status: DC
  Administered 2016-12-18 – 2016-12-20 (×3): 10 mg via ORAL
  Filled 2016-12-18 (×3): qty 1

## 2016-12-18 MED ORDER — FUROSEMIDE 10 MG/ML IJ SOLN
40.0000 mg | Freq: Once | INTRAMUSCULAR | Status: AC
  Administered 2016-12-18: 40 mg via INTRAVENOUS
  Filled 2016-12-18: qty 4

## 2016-12-18 MED ORDER — POLYVINYL ALCOHOL 1.4 % OP SOLN: 1.0000 [drp] | Freq: Every day | OPHTHALMIC | Status: DC | PRN

## 2016-12-18 MED ORDER — PRAVASTATIN SODIUM 40 MG PO TABS
40.0000 mg | ORAL_TABLET | Freq: Every day | ORAL | Status: DC
  Administered 2016-12-18 – 2016-12-19 (×2): 40 mg via ORAL
  Filled 2016-12-18 (×2): qty 1

## 2016-12-18 MED ORDER — CLOPIDOGREL BISULFATE 75 MG PO TABS
75.0000 mg | ORAL_TABLET | Freq: Every day | ORAL | Status: DC
  Administered 2016-12-18 – 2016-12-20 (×3): 75 mg via ORAL
  Filled 2016-12-18 (×3): qty 1

## 2016-12-18 MED ORDER — VENLAFAXINE HCL ER 75 MG PO CP24
150.0000 mg | ORAL_CAPSULE | Freq: Every day | ORAL | Status: DC
  Administered 2016-12-18 – 2016-12-20 (×3): 150 mg via ORAL
  Filled 2016-12-18: qty 1
  Filled 2016-12-18 (×2): qty 2

## 2016-12-18 MED ORDER — FERROUS SULFATE 325 (65 FE) MG PO TABS
325.0000 mg | ORAL_TABLET | Freq: Two times a day (BID) | ORAL | Status: DC
  Administered 2016-12-18 – 2016-12-20 (×6): 325 mg via ORAL
  Filled 2016-12-18 (×7): qty 1

## 2016-12-18 MED ORDER — ENOXAPARIN SODIUM 30 MG/0.3ML ~~LOC~~ SOLN
30.0000 mg | SUBCUTANEOUS | Status: DC
  Administered 2016-12-18 – 2016-12-19 (×2): 30 mg via SUBCUTANEOUS
  Filled 2016-12-18 (×2): qty 0.3

## 2016-12-18 MED ORDER — DONEPEZIL HCL 10 MG PO TABS
10.0000 mg | ORAL_TABLET | Freq: Every day | ORAL | Status: DC
  Administered 2016-12-18 – 2016-12-19 (×2): 10 mg via ORAL
  Filled 2016-12-18 (×2): qty 1

## 2016-12-18 NOTE — Progress Notes (Signed)
Patient admitted after midnight, please see H&P.  Pulses palpable with doppler.  Skin tight, will get ortho consult-- doubt will be able to get MRI as patient not able to keep legs still.  Change abx to cover prior staph infection on left leg.  Marlin CanaryJessica Hazel Leveille DO

## 2016-12-18 NOTE — ED Notes (Signed)
Pt placed on bed pain for BM

## 2016-12-18 NOTE — Progress Notes (Signed)
Patient ID: Artelia LarocheBeverly Whetsell, female   DOB: 25-Dec-1927, 83 y.o.   MRN: [id number]  Re-examined pt this afternoon. No change in subjective assessment of RLE. Compartments still soft, pulses palpable, and sensation intact and normal. Encouraged continued elevation. Will recheck in AM.    Freeman CaldronMichael J. Yianna Tersigni, PA-C Orthopedic Surgery [phone]

## 2016-12-18 NOTE — ED Notes (Signed)
Patient placed in a hospital bed.

## 2016-12-18 NOTE — ED Notes (Signed)
MD able to doppler L dorsalis pedis pulse

## 2016-12-18 NOTE — Consult Note (Signed)
Reason for Consult:R/o compartment syndrome Referring Physician: Princella Ion  Sheila Barnes is an 83 y.o. female with RA, CKD. HPI: Celes fell on Friday. It was unwitnessed but she says she fell on her butt. Her daughter found her. She was checked out by ALF staff and was ok. The daughter noted the appearance of the right leg yesterday and brought her in for evaluation. The patient c/o left leg pain (which is chronic) but denied RLE pain. She has had about 4 falls in the past month. IM was concerned about the appearance of the leg and was worried about compartment syndrome and orthopedic surgery was consulted.  Past Medical History:  Diagnosis Date  . Allergy   . Anemia   . Arthritis   . Asthma   . Blood transfusion without reported diagnosis   . Cataract   . CHF (congestive heart failure) ([place])   . Chronic kidney disease   . Heart murmur   . Macular degeneration     Past Surgical History:  Procedure Laterality Date  . ABDOMINAL HYSTERECTOMY    . APPENDECTOMY    . CATARACT EXTRACTION    . HIP SURGERY    . SPINE SURGERY    . TONSILLECTOMY      Family History  Problem Relation Age of Onset  . Cardiomyopathy Mother   . Heart failure Mother   . CAD Father     Social History:  reports that she has quit smoking. she has never used smokeless tobacco. She reports that she does not drink alcohol or use drugs.  Allergies:  Allergies  Allergen Reactions  . Nsaids     Pt can't have   . Prednisone     Blood infection    Medications: I have reviewed the patient's current medications.  Results for orders placed or performed during the hospital encounter of 12/17/16 (from the past 48 hour(s))  Comprehensive metabolic panel     Status: Abnormal   Collection Time: 12/17/16  4:58 PM  Result Value Ref Range   Sodium 139 135 - 145 mmol/L   Potassium 5.4 (H) 3.5 - 5.1 mmol/L   Chloride 106 101 - 111 mmol/L   CO2 24 22 - 32 mmol/L   Glucose, Bld 87 65 - 99 mg/dL   BUN 41 (H) 6 -  20 mg/dL   Creatinine, Ser 2.05 (H) 0.44 - 1.00 mg/dL   Calcium 9.1 8.9 - 10.3 mg/dL   Total Protein 7.5 6.5 - 8.1 g/dL   Albumin 3.3 (L) 3.5 - 5.0 g/dL   AST 31 15 - 41 U/L   ALT 33 14 - 54 U/L   Alkaline Phosphatase 83 38 - 126 U/L   Total Bilirubin 0.4 0.3 - 1.2 mg/dL   GFR calc non Af Amer 21 (L) >60 mL/min   GFR calc Af Amer 24 (L) >60 mL/min    Comment: (NOTE) The eGFR has been calculated using the CKD EPI equation. This calculation has not been validated in all clinical situations. eGFR's persistently <60 mL/min signify possible Chronic Kidney Disease.    Anion gap 9 5 - 15  CBC with Differential     Status: Abnormal   Collection Time: 12/17/16  4:58 PM  Result Value Ref Range   WBC 9.5 4.0 - 10.5 K/uL   RBC 3.57 (L) 3.87 - 5.11 MIL/uL   Hemoglobin 11.0 (L) 12.0 - 15.0 g/dL   HCT 35.0 (L) 36.0 - 46.0 %   MCV 98.0 78.0 - 100.0 fL  MCH 30.8 26.0 - 34.0 pg   MCHC 31.4 30.0 - 36.0 g/dL   RDW 18.6 (H) 11.5 - 15.5 %   Platelets 172 150 - 400 K/uL   Neutrophils Relative % 67 %   Neutro Abs 6.3 1.7 - 7.7 K/uL   Lymphocytes Relative 18 %   Lymphs Abs 1.7 0.7 - 4.0 K/uL   Monocytes Relative 11 %   Monocytes Absolute 1.1 (H) 0.1 - 1.0 K/uL   Eosinophils Relative 4 %   Eosinophils Absolute 0.4 0.0 - 0.7 K/uL   Basophils Relative 0 %   Basophils Absolute 0.0 0.0 - 0.1 K/uL  I-Stat CG4 Lactic Acid, ED     Status: None   Collection Time: 12/17/16  5:25 PM  Result Value Ref Range   Lactic Acid, Venous 1.69 0.5 - 1.9 mmol/L  Urinalysis, Routine w reflex microscopic     Status: Abnormal   Collection Time: 12/17/16  6:54 PM  Result Value Ref Range   Color, Urine YELLOW YELLOW   APPearance HAZY (A) CLEAR   Specific Gravity, Urine 1.013 1.005 - 1.030   pH 5.0 5.0 - 8.0   Glucose, UA NEGATIVE NEGATIVE mg/dL   Hgb urine dipstick NEGATIVE NEGATIVE   Bilirubin Urine NEGATIVE NEGATIVE   Ketones, ur NEGATIVE NEGATIVE mg/dL   Protein, ur NEGATIVE NEGATIVE mg/dL   Nitrite NEGATIVE  NEGATIVE   Leukocytes, UA TRACE (A) NEGATIVE   RBC / HPF 0-5 0 - 5 RBC/hpf   WBC, UA 0-5 0 - 5 WBC/hpf   Bacteria, UA NONE SEEN NONE SEEN   Squamous Epithelial / LPF 0-5 (A) NONE SEEN  Brain natriuretic peptide     Status: Abnormal   Collection Time: 12/17/16 11:19 PM  Result Value Ref Range   B Natriuretic Peptide 593.8 (H) 0.0 - 100.0 pg/mL  Troponin I (q 6hr x 3)     Status: Abnormal   Collection Time: 12/18/16  5:01 AM  Result Value Ref Range   Troponin I 0.03 (HH) <0.03 ng/mL    Comment: CRITICAL RESULT CALLED TO, READ BACK BY AND VERIFIED WITH: MOON,K RN 12/18/2016 0631 JORDANS   TSH     Status: Abnormal   Collection Time: 12/18/16  5:01 AM  Result Value Ref Range   TSH 6.179 (H) 0.350 - 4.500 uIU/mL    Comment: Performed by a 3rd Generation assay with a functional sensitivity of <=0.01 uIU/mL.  Troponin I (q 6hr x 3)     Status: Abnormal   Collection Time: 12/18/16  8:20 AM  Result Value Ref Range   Troponin I 0.04 (HH) <0.03 ng/mL    Comment: CRITICAL RESULT CALLED TO, READ BACK BY AND VERIFIED WITH: HOPE DOOLEY,RN AT 0914 12/18/16 BY ZBEECH,   Comprehensive metabolic panel     Status: Abnormal   Collection Time: 12/18/16  8:20 AM  Result Value Ref Range   Sodium 138 135 - 145 mmol/L   Potassium 4.0 3.5 - 5.1 mmol/L   Chloride 106 101 - 111 mmol/L   CO2 22 22 - 32 mmol/L   Glucose, Bld 97 65 - 99 mg/dL   BUN 40 (H) 6 - 20 mg/dL   Creatinine, Ser 1.86 (H) 0.44 - 1.00 mg/dL   Calcium 8.9 8.9 - 10.3 mg/dL   Total Protein 7.0 6.5 - 8.1 g/dL   Albumin 3.1 (L) 3.5 - 5.0 g/dL   AST 40 15 - 41 U/L   ALT 20 14 - 54 U/L   Alkaline Phosphatase 96  38 - 126 U/L   Total Bilirubin 1.1 0.3 - 1.2 mg/dL   GFR calc non Af Amer 23 (L) >60 mL/min   GFR calc Af Amer 27 (L) >60 mL/min    Comment: (NOTE) The eGFR has been calculated using the CKD EPI equation. This calculation has not been validated in all clinical situations. eGFR's persistently <60 mL/min signify possible Chronic  Kidney Disease.    Anion gap 10 5 - 15  CBC     Status: Abnormal   Collection Time: 12/18/16  8:20 AM  Result Value Ref Range   WBC 9.2 4.0 - 10.5 K/uL   RBC 3.56 (L) 3.87 - 5.11 MIL/uL   Hemoglobin 10.8 (L) 12.0 - 15.0 g/dL   HCT 34.2 (L) 36.0 - 46.0 %   MCV 96.1 78.0 - 100.0 fL   MCH 30.3 26.0 - 34.0 pg   MCHC 31.6 30.0 - 36.0 g/dL   RDW 18.3 (H) 11.5 - 15.5 %   Platelets 171 150 - 400 K/uL    Dg Chest 2 View  Result Date: 12/18/2016 CLINICAL DATA:  83 year old female with fall and right lower extremity pain. EXAM: CHEST  2 VIEW COMPARISON:  None. FINDINGS: There is mild cardiomegaly. There is atherosclerotic calcification of the aortic arch. There is no focal consolidation, pleural effusion, or pneumothorax. Degenerative changes of the shoulders an spine. Age indeterminate, possibly chronic midthoracic compression deformity with anterior wedging. Correlation with clinical exam and point tenderness recommended. IMPRESSION: 1. No acute cardiopulmonary process. 2. Age indeterminate compression deformity of the midthoracic vertebra, possibly chronic. Correlation with point tenderness recommended. Electronically Signed   By: Anner Crete M.D.   On: 12/18/2016 00:30   Dg Tibia/fibula Right  Result Date: 12/18/2016 CLINICAL DATA:  83 year old female with fall and right lower extremity pain. EXAM: RIGHT TIBIA AND FIBULA - 2 VIEW COMPARISON:  None. FINDINGS: There is severe osteoarthritic changes of the knee with narrowing of the medial and lateral compartments and bone spurring. There is sclerotic changes of the medial tibial plateau. An age indeterminate minimally depressed fracture of the medial tibial plateau is not excluded. Correlation with clinical exam and point tenderness recommended. MRI may provide better evaluation if clinically indicated. No other acute fracture identified. There is no dislocation. The bones are osteopenic. There is mild soft tissue swelling anterior to the  proximal tibia. There is atherosclerotic calcification of the vasculature. Mild diffuse subcutaneous edema. IMPRESSION: Severe osteoarthritic changes of the knee with narrowing of the medial and lateral compartment and possible associated age indeterminate compression of the medial tibial plateau. MRI may provide better evaluation if clinically indicated. No other acute fracture identified. No dislocation. Electronically Signed   By: Anner Crete M.D.   On: 12/18/2016 00:38    Review of Systems  Constitutional: Negative for weight loss.  HENT: Negative for ear discharge, ear pain, hearing loss and tinnitus.   Eyes: Negative for blurred vision, double vision, photophobia and pain.  Respiratory: Negative for cough, sputum production and shortness of breath.   Cardiovascular: Negative for chest pain.  Gastrointestinal: Negative for abdominal pain, nausea and vomiting.  Genitourinary: Negative for dysuria, flank pain, frequency and urgency.  Musculoskeletal: Positive for falls and joint pain (Left leg/back). Negative for back pain, myalgias and neck pain.  Neurological: Negative for dizziness, tingling, sensory change, focal weakness, loss of consciousness and headaches.  Endo/Heme/Allergies: Does not bruise/bleed easily.  Psychiatric/Behavioral: Negative for depression, memory loss and substance abuse. The patient is not nervous/anxious.  Blood pressure (!) 141/96, pulse (!) 122, temperature 98.7 F (37.1 C), temperature source Rectal, resp. rate 15, height _0  (1.676 m), weight 73 kg (160 lb 15 oz), SpO2 95 %. Physical Exam  Constitutional: She appears well-developed and well-nourished. No distress.  HENT:  Head: Normocephalic.  Eyes: Conjunctivae are normal.  Neck: Normal range of motion.  Cardiovascular: Regular rhythm. Frequent extrasystoles are present. Tachycardia present.  Respiratory: Effort normal. No respiratory distress.  Musculoskeletal:  RLE Proximal lower leg ecchymotic  and hyperemic with some areas of weeping  TTP proximal calf  No knee or ankle effusion  Knee stable to varus/ valgus and anterior/posterior stress  Sens DPN, SPN, TN intact  Motor EHL, ext, flex, evers 5/5  DP 2+, PT 2+, 1+ pitting edema, Homan's negative  LLE No traumatic wounds, ecchymosis, some hyperemia proximal lower leg  TTP calf, >RLE  No knee or ankle effusion  Knee stable to varus/ valgus and anterior/posterior stress  Sens DPN, SPN, TN intact  Motor EHL, ext, flex, evers 5/5  DP dopplerable, 1+ pitting edema, >RLE  Neurological: She is alert.  Skin: Skin is warm and dry. She is not diaphoretic.  Psychiatric: She has a normal mood and affect. Her behavior is normal.      Assessment/Plan: RLE ecchymoses, tenderness -- Not particularly concerned about compartment syndrome with length of time from injury, palpable pulses, and lack of sensory changes. Will check on pt this afternoon to make sure there's no changes. Given stable knee exam and lack of tenderness do not think MRI will be of benefit. Will get CT to make sure we are not missing a plateau fx. No weight bearing or activity restrictions from an orthopedic standpoint but should keep leg elevated whenever possible. Dr. Lorin Mercy to evaluate this afternoon.  LLE diminished pulses -- Will add ABI to workup for LLE.    Lisette Abu, PA-C Orthopedic Surgery [phone] 12/18/2016, 10:53 AM        Lorin Mercy note. I reviewed exam , CT , xrays , labs  . Daughter at bedside currently.      Patient has a prepatellar bursa hematoma with extension to pre-tibial tubercle bursa.   Compartments soft, CT neg for Fx but she has tricompartmental OA present.  OK to start weight bearing as tolerated but with venous stasis and soft tissue swelling it may be a day or so before she can tolerate weightbearing on her leg.  Elevation for leg swelling.   My cell [phone]

## 2016-12-18 NOTE — ED Notes (Signed)
Patient transported to CT 

## 2016-12-18 NOTE — Progress Notes (Signed)
Received call from CCMD that patient experienced trigeminal PVC's. Upon assessment pt was sleeping, experienced no symptoms that she was aware of. Notified MD on call, will continue to monitor.

## 2016-12-18 NOTE — ED Notes (Signed)
Notified Dr. Benjamine MolaVann that 2 RN's have been unable to dopler pulse on pt's left foot. Pt's troponin also elevated, denies CP. MD notified. Dr. Benjamine MolaVann coming to bedside

## 2016-12-18 NOTE — Progress Notes (Signed)
  Echocardiogram 2D Echocardiogram has been performed.  Kalany Diekmann T Ahman Dugdale 12/18/2016, 4:17 PM

## 2016-12-18 NOTE — ED Notes (Signed)
Daughter went home and took all patient belongings including her wheelchair

## 2016-12-18 NOTE — H&P (Signed)
TRH H&P   Patient Demographics:    Sheila Barnes, is a 83 y.o. female  MRN: [id number]   DOB - [birth year]  Admit Date - 12/17/2016  Outpatient Primary MD for the patient is Patient, No Pcp Per [facility]  Referring MD/NP/PA:   Ross Marcusourtney Horton  Outpatient Specialists:    Viann FishSpencer Tilley  Patient coming from: [facility]  No chief complaint on file.     HPI:    Sheila Barnes  is a 83 y.o. female, w CKD, (uncertain baseline creatinine, CHF(unknownEF)  , has had cellulitis tx 10/9 w doxycycline at [facility] and then prescribed a 2nd round of doxycycline by PA .  Family noted that she fell on Friday , she was leaning forward to get something and fell forward.  Landed on knee.  Pt notes redness has been getting worse over the past few days.   In ED,  Pt noted to have bruising over the bilateral distal lower ext   CXR IMPRESSION: 1. No acute cardiopulmonary process. 2. Age indeterminate compression deformity of the midthoracic vertebra, possibly chronic. Correlation with point tenderness recommended.   IMPRESSION: Severe osteoarthritic changes of the knee with narrowing of the medial and lateral compartment and possible associated age indeterminate compression of the medial tibial plateau. MRI may provide better evaluation if clinically indicated. No other acute fracture identified. No dislocation.  Na 139, K 5.4,  BUN 41, creatinine 2.05 Wbc 9.5, Hgb 11.0, Plt 172 BNP 593.8  Pt will be admitted for cellulitis, edema.  right knee pain, hyperkalemia   Review of systems:    In addition to the HPI above, No Fever-chills, No Headache, No changes with Vision or hearing, No problems swallowing food or Liquids, No Chest pain, Cough or Shortness of Breath, No Abdominal pain, No Nausea or Vommitting, Bowel movements are  regular, No Blood in stool or Urine, No dysuria, No new skin rashes or bruises, No new joints pains-aches,  No new weakness, tingling, numbness in any extremity, No recent weight gain or loss, No polyuria, polydypsia or polyphagia, No significant Mental Stressors.  A full 10 point Review of Systems was done, except as stated above, all other Review of Systems were negative.   With Past History of the following :    Past Medical History:  Diagnosis Date  . Allergy   . Anemia   . Arthritis   . Asthma   . Blood transfusion without reported diagnosis   . Cataract   . CHF (congestive heart failure) (HCC)   . Chronic kidney disease   . Heart murmur   . Macular degeneration       Past Surgical History:  Procedure Laterality Date  . ABDOMINAL HYSTERECTOMY    . APPENDECTOMY    . CATARACT EXTRACTION    . HIP SURGERY    . SPINE SURGERY    .  TONSILLECTOMY        Social History:     Social History   Tobacco Use  . Smoking status: Former Games developer  . Smokeless tobacco: Never Used  Substance Use Topics  . Alcohol use: No     Lives - at home  Mobility - unclear   Family History :     Family History  Problem Relation Age of Onset  . Cardiomyopathy Mother   . Heart failure Mother   . CAD Father       Home Medications:   Prior to Admission medications   Medication Sig Start Date End Date Taking? Authorizing Provider  acetaminophen (TYLENOL) 325 MG tablet Take 650 mg by mouth at bedtime.    Yes [provider]  acetaminophen (TYLENOL) 325 MG tablet Take 650 mg by mouth every 6 (six) hours as needed for mild pain, moderate pain or fever.   Yes [provider]  alum & mag hydroxide-simeth (GERI-LANTA) 200-200-20 MG/5ML suspension Take 30 mLs by mouth every 6 (six) hours as needed for indigestion or heartburn.   Yes [provider]  aspirin 81 MG tablet Take 81 mg by mouth daily.   Yes [provider]  carboxymethylcellulose  (REFRESH TEARS) 0.5 % SOLN Place 1 drop into the left eye every 6 (six) hours as needed (for dry eyes).   Yes [provider]  carboxymethylcellulose (REFRESH TEARS) 0.5 % SOLN Place 1 drop into both eyes daily as needed (for dry eyes).   Yes [provider]  cholecalciferol (VITAMIN D) 1000 units tablet Take 1,000 Units by mouth daily.   Yes [provider]  clopidogrel (PLAVIX) 75 MG tablet Take 75 mg by mouth daily.   Yes [provider]  diclofenac sodium (VOLTAREN) 1 % GEL Apply 4 g topically 3 (three) times daily.    Yes [provider]  docusate sodium (COLACE) 250 MG capsule Take 250 mg by mouth daily as needed for constipation.    Yes [provider]  donepezil (ARICEPT) 10 MG tablet Take 10 mg by mouth at bedtime.   Yes [provider]  ferrous sulfate 325 (65 FE) MG tablet Take 325 mg by mouth 2 (two) times daily with a meal.    Yes [provider]  furosemide (LASIX) 40 MG tablet Take 20 mg by mouth daily.    Yes [provider]  gabapentin (NEURONTIN) 100 MG capsule Take 200 mg by mouth 3 (three) times daily.    Yes [provider]  isosorbide mononitrate (IMDUR) 30 MG 24 hr tablet Take 30 mg by mouth daily.   Yes [provider]  levothyroxine (SYNTHROID, LEVOTHROID) 75 MCG tablet Take 75 mcg by mouth daily before breakfast.   Yes [provider]  lisinopril (PRINIVIL,ZESTRIL) 2.5 MG tablet Take 2.5 mg by mouth daily.   Yes [provider]  loperamide (IMODIUM A-D) 2 MG tablet Take 2 mg by mouth 4 (four) times daily as needed for diarrhea or loose stools.   Yes [provider]  loratadine (CLARITIN) 10 MG tablet Take 10 mg by mouth daily.   Yes [provider]  lovastatin (ALTOPREV) 40 MG 24 hr tablet Take 40 mg by mouth at bedtime.   Yes [provider]  magnesium hydroxide (MILK OF MAGNESIA) 400 MG/5ML suspension Take 30 mLs by mouth daily as  needed for mild constipation.   Yes [provider]  metoprolol tartrate (LOPRESSOR) 25 MG tablet Take 25 mg by mouth 2 (  two) times daily.   Yes [provider]  Multiple Vitamins-Minerals (ICAPS MV PO) Take 1 capsule by mouth daily.    Yes [provider]  nitroGLYCERIN (NITROSTAT) 0.4 MG SL tablet Place 0.4 mg under the tongue every 5 (five) minutes as needed for chest pain.   Yes [provider]  nystatin (MYCOSTATIN/NYSTOP) powder Apply topically daily as needed (to rash).   Yes [provider]  pantoprazole (PROTONIX) 40 MG tablet Take 40 mg by mouth daily.   Yes [provider]  polyethylene glycol (MIRALAX / GLYCOLAX) packet Take 17 g by mouth daily as needed for mild constipation.   Yes [provider]  PROAIR HFA 108 (90 Base) MCG/ACT inhaler Inhale 2 puffs into the lungs 2 (two) times daily.  12/13/16  Yes [provider]  traMADol (ULTRAM) 50 MG tablet Take 50 mg by mouth 2 (two) times daily as needed for moderate pain.    Yes [provider]  venlafaxine XR (EFFEXOR-XR) 150 MG 24 hr capsule Take 150 mg by mouth daily with breakfast.   Yes [provider]  vitamin B-12 (CYANOCOBALAMIN) 1000 MCG tablet Take 1,000 mcg by mouth daily.   Yes [provider]  doxycycline (VIBRA-TABS) 100 MG tablet Take 1 tablet (100 mg total) by mouth 2 (two) times daily. Patient not taking: Reported on 12/18/2016 10/03/16   McVey, Madelaine BhatElizabeth Whitney, PA-C  metoprolol succinate (TOPROL-XL) 25 MG 24 hr tablet Take 25 mg by mouth daily.    [provider]  polymixin-bacitracin (POLYSPORIN) 500-10000 UNIT/GM OINT ointment Apply 1 application topically 2 (two) times daily. Patient not taking: Reported on 12/18/2016 10/07/16   McVey, Madelaine BhatElizabeth Whitney, PA-C     Allergies:     Allergies  Allergen Reactions  . Nsaids     Pt can't have   . Prednisone     Blood infection     Physical Exam:    Vitals  Blood pressure 118/75, pulse (!) 49, temperature 97.9 F (36.6 C), temperature source Oral, resp. rate 16, height 5\' 6"  (1.676 m), SpO2 97 %.   1. General lying in bed in NAD,   2. Normal affect and insight, Not Suicidal or Homicidal, Awake Alert, Oriented X 3.  3. No F.N deficits, ALL C.Nerves Intact, Strength 5/5 all 4 extremities, Sensation intact all 4 extremities, Plantars down going.  4. Ears and Eyes appear Normal, Conjunctivae clear, PERRLA. Moist Oral Mucosa.  5. Supple Neck, No JVD, No cervical lymphadenopathy appriciated, No Carotid Bruits.  6. Symmetrical Chest wall movement, Good air movement bilaterally, CTAB.  7. RRR, No Gallops, Rubs or Murmurs, No Parasternal Heave.  8. Positive Bowel Sounds, Abdomen Soft, No tenderness, No organomegaly appriciated,No rebound -guarding or rigidity.  9.  No Cyanosis, redness over the bilateral distal lower ext, 1+ edema.  Bruise over the right knee  10. Good muscle tone,  joints appear normal , no effusions, Normal ROM.  11. No Palpable Lymph Nodes in Neck or Axillae     Data Review:    CBC Recent Labs  Lab 12/17/16 1658  WBC 9.5  HGB 11.0*  HCT 35.0*  PLT 172  MCV 98.0  MCH 30.8  MCHC 31.4  RDW 18.6*  LYMPHSABS 1.7  MONOABS 1.1*  EOSABS 0.4  BASOSABS 0.0   ------------------------------------------------------------------------------------------------------------------  Chemistries  Recent Labs  Lab 12/17/16 1658  NA 139  K 5.4*  CL 106  CO2 24  GLUCOSE 87  BUN 41*  CREATININE 2.05*  CALCIUM 9.1  AST 31  ALT 33  ALKPHOS 83  BILITOT 0.4   ------------------------------------------------------------------------------------------------------------------ CrCl cannot be calculated (Unknown ideal weight.). ------------------------------------------------------------------------------------------------------------------ No results for input(s): TSH, T4TOTAL, T3FREE, THYROIDAB in the last 72  hours.  Invalid input(s): FREET3  Coagulation profile No results for input(s): INR, PROTIME in the last 168 hours. ------------------------------------------------------------------------------------------------------------------- No results for input(s): DDIMER in the last 72 hours. -------------------------------------------------------------------------------------------------------------------  Cardiac Enzymes No results for input(s): CKMB, TROPONINI, MYOGLOBIN in the last 168 hours.  Invalid input(s): CK ------------------------------------------------------------------------------------------------------------------    Component Value Date/Time   BNP 593.8 (H) 12/17/2016 2319     ---------------------------------------------------------------------------------------------------------------  Urinalysis    Component Value Date/Time   COLORURINE YELLOW 12/17/2016 1854   APPEARANCEUR HAZY (A) 12/17/2016 1854   LABSPEC 1.013 12/17/2016 1854   PHURINE 5.0 12/17/2016 1854   GLUCOSEU NEGATIVE 12/17/2016 1854   HGBUR NEGATIVE 12/17/2016 1854   BILIRUBINUR NEGATIVE 12/17/2016 1854   KETONESUR NEGATIVE 12/17/2016 1854   PROTEINUR NEGATIVE 12/17/2016 1854   NITRITE NEGATIVE 12/17/2016 1854   LEUKOCYTESUR TRACE (A) 12/17/2016 1854    ----------------------------------------------------------------------------------------------------------------   Imaging Results:    Dg Chest 2 View  Result Date: 12/18/2016 CLINICAL DATA:  83 year old female with fall and right lower extremity pain. EXAM: CHEST  2 VIEW COMPARISON:  None. FINDINGS: There is mild cardiomegaly. There is atherosclerotic calcification of the aortic arch. There is no focal consolidation, pleural effusion, or pneumothorax. Degenerative changes of the shoulders an spine. Age indeterminate, possibly chronic midthoracic compression deformity with anterior wedging. Correlation with clinical exam and point tenderness  recommended. IMPRESSION: 1. No acute cardiopulmonary process. 2. Age indeterminate compression deformity of the midthoracic vertebra, possibly chronic. Correlation with point tenderness recommended. Electronically Signed   By: Elgie Collard M.D.   On: 12/18/2016 00:30   Dg Tibia/fibula Right  Result Date: 12/18/2016 CLINICAL DATA:  83 year old female with fall and right lower extremity pain. EXAM: RIGHT TIBIA AND FIBULA - 2 VIEW COMPARISON:  None. FINDINGS: There is severe osteoarthritic changes of the knee with narrowing of the medial and lateral compartments and bone spurring. There is sclerotic changes of the medial tibial plateau. An age indeterminate minimally depressed fracture of the medial tibial plateau is not excluded. Correlation with clinical exam and point tenderness recommended. MRI may provide better evaluation if clinically indicated. No other acute fracture identified. There is no dislocation. The bones are osteopenic. There is mild soft tissue swelling anterior to the proximal tibia. There is atherosclerotic calcification of the vasculature. Mild diffuse subcutaneous edema. IMPRESSION: Severe osteoarthritic changes of the knee with narrowing of the medial and lateral compartment and possible associated age indeterminate compression of the medial tibial plateau. MRI may provide better evaluation if clinically indicated. No other acute fracture identified. No dislocation. Electronically Signed   By: Elgie Collard M.D.   On: 12/18/2016 00:38      Assessment & Plan:    Active Problems:   Cellulitis    Cellulitis Rocephin 1 gm iv qday  Hyperkalemia Kayexalate 15gm po x1  CKD Check cmp in am Please try to find out her baseline creatinine in am  Bradycardia Tele Trop I q6h x3 HOLD metoprolol  Edema Pt received lasix 40mg  iv x1 in ED Cont lasix 20mg  po qday LE ultrasound r/o DVT Check cardiac echo  Hypothyroidism Cont levothyroxine Check tsh  R knee pain MRI  knee  Hyperlipidemia Cont lovastatin    DVT Prophylaxis Lovenox  AM Labs Ordered, also please review Full Orders  Family Communication: Admission, patients condition  and plan of care including tests being ordered have been discussed with the patient FULL CODE who indicate understanding and agree with the plan and Code Status.  Code Status FULL CODE  Likely DC to  home  Condition GUARDED   Consults called: none  Admission status: inpatient   Time spent in minutes : 45   Pearson Grippe M.D on 12/18/2016 at 2:28 AM  Between 7am to 7pm - Pager - [phone]  . After 7pm go to [email or web address] - password [email or web address]  [facility] - Office  [phone]

## 2016-12-18 NOTE — Progress Notes (Signed)
Received call from CCMD, pt in SVR with HR frequently jumping into 120's. Pt also experienced 1.56 second pause in HR. Upon assessment, pt is sleeping and asymptomatic. Notified MD on call and will continue to monitor.

## 2016-12-18 NOTE — ED Notes (Signed)
Placed purewick on pt and changed.

## 2016-12-18 NOTE — ED Notes (Signed)
Patient resting on hospital bed , states she hurts all over.

## 2016-12-18 NOTE — ED Notes (Signed)
Patient in MRI , introduced myself to family

## 2016-12-18 NOTE — Progress Notes (Signed)
Small elevation of troponin-- echo read pending -with LE bruising would not be a heparin candidate Marlin CanaryJessica Caige Barnes

## 2016-12-19 ENCOUNTER — Inpatient Hospital Stay (HOSPITAL_COMMUNITY): Payer: Medicare Other

## 2016-12-19 DIAGNOSIS — R609 Edema, unspecified: Secondary | ICD-10-CM

## 2016-12-19 DIAGNOSIS — M7989 Other specified soft tissue disorders: Secondary | ICD-10-CM

## 2016-12-19 DIAGNOSIS — E875 Hyperkalemia: Secondary | ICD-10-CM

## 2016-12-19 LAB — TROPONIN I: Troponin I: 0.08 ng/mL (ref <0.03)

## 2016-12-19 LAB — BASIC METABOLIC PANEL
Anion gap: 13 (ref 5–15)
BUN: 37 mg/dL — AB (ref 6–20)
CHLORIDE: 98 mmol/L — AB (ref 101–111)
CO2: 23 mmol/L (ref 22–32)
Calcium: 8.4 mg/dL — ABNORMAL LOW (ref 8.9–10.3)
Creatinine, Ser: 1.96 mg/dL — ABNORMAL HIGH (ref 0.44–1.00)
GFR calc Af Amer: 25 mL/min — ABNORMAL LOW (ref >60)
GFR calc non Af Amer: 22 mL/min — ABNORMAL LOW (ref >60)
GLUCOSE: 86 mg/dL (ref 65–99)
POTASSIUM: 3.5 mmol/L (ref 3.5–5.1)
Sodium: 134 mmol/L — ABNORMAL LOW (ref 135–145)

## 2016-12-19 LAB — ECHOCARDIOGRAM COMPLETE
Height: 66 in
WEIGHTICAEL: 2574.97 [oz_av]

## 2016-12-19 LAB — CBC
HCT: 32.5 % — ABNORMAL LOW (ref 36.0–46.0)
HEMOGLOBIN: 10.3 g/dL — AB (ref 12.0–15.0)
MCH: 30.6 pg (ref 26.0–34.0)
MCHC: 31.7 g/dL (ref 30.0–36.0)
MCV: 96.4 fL (ref 78.0–100.0)
Platelets: 162 10*3/uL (ref 150–400)
RBC: 3.37 MIL/uL — AB (ref 3.87–5.11)
RDW: 18.8 % — ABNORMAL HIGH (ref 11.5–15.5)
WBC: 8.2 10*3/uL (ref 4.0–10.5)

## 2016-12-19 MED ORDER — ALBUTEROL SULFATE (2.5 MG/3ML) 0.083% IN NEBU
3.0000 mL | INHALATION_SOLUTION | Freq: Two times a day (BID) | RESPIRATORY_TRACT | Status: DC
  Administered 2016-12-20: 3 mL via RESPIRATORY_TRACT
  Filled 2016-12-19: qty 3

## 2016-12-19 NOTE — Progress Notes (Signed)
Patient is unable to tolerate even the movement of the imaging probe down the leg for an adequate evaluation. What was able to be visualized in the right thigh appears to be patent. Patient would not allow the test to be continued due to pain even though medication had been given prior to coming to the department. Patient will also not be able to tolerate the cuff pressure for the ABIs. We will place the patient on hold. Please advise if still required if pain can be controlled.

## 2016-12-19 NOTE — Progress Notes (Signed)
Patient ID: Sheila LarocheBeverly Montelongo, female   DOB: 10/02/1927, 83 y.o.   MRN: [id number]   LOS: 1 day   Subjective: No change in BLE symptoms   Objective: Vital signs in last 24 hours: Temp:  [97.9 F (36.6 C)-98.7 F (37.1 C)] 97.9 F (36.6 C) (12/04 0454) Pulse Rate:  [68-130] 70 (12/04 0454) Resp:  [12-19] 18 (12/04 0454) BP: (106-141)/(60-96) 106/60 (12/04 0454) SpO2:  [92 %-98 %] 96 % (12/04 0454) Weight:  [73 kg (160 lb 15 oz)-74.9 kg (165 lb 3.2 oz)] 74.9 kg (165 lb 3.2 oz) (12/04 0454) Last BM Date: 12/18/16   Laboratory  CBC Recent Labs    12/18/16 0820 12/19/16 0418  WBC 9.2 8.2  HGB 10.8* 10.3*  HCT 34.2* 32.5*  PLT 171 162   BMET Recent Labs    12/18/16 0820 12/19/16 0418  NA 138 134*  K 4.0 3.5  CL 106 98*  CO2 22 23  GLUCOSE 97 86  BUN 40* 37*  CREATININE 1.86* 1.96*  CALCIUM 8.9 8.4*     Physical Exam General appearance: alert and no distress  BLE: Warm, palpable pulses RLE, calf swollen but not firm, mild TTP, sensation intact distally   Assessment/Plan: RLE ecchymoses, tenderness -- No signs of compartment syndrome, CT was negative for occult fx. Orthopedics will sign off, please call for questions. LLE diminished pulses -- Awaiting ABI's, will leave attending to f/u    Freeman CaldronMichael J. Jamielee Mchale, PA-C Orthopedic Surgery [phone] 12/19/2016

## 2016-12-19 NOTE — Progress Notes (Signed)
Pt with bigeminal PVC's and nonsustained episode of bradycardia with HR 39. Notified MD. Pt HR now 112, pt resting with no symptoms. Will continue to monitor.

## 2016-12-19 NOTE — Progress Notes (Signed)
PROGRESS NOTE    Sheila Barnes  ZOX:[id number] DOB: 01-23-1927 DOA: 12/17/2016 PCP: Patient, No Pcp Per   Outpatient Specialists:     Brief Narrative:  Sheila Barnes  is a 83 y.o. female, w CKD, (uncertain baseline creatinine, CHF(unknownEF)  , has had cellulitis tx 10/9 w doxycycline at [facility] urgent care and then prescribed a 2nd round of doxycycline by PA .  Family noted that she fell on Friday , she was leaning forward to get something and fell forward.  Landed on knee.  Pt notes redness has been getting worse over the past few days.   Assessment & Plan:   Principal Problem:   Cellulitis Active Problems:   Hyperkalemia   CKD (chronic kidney disease)   Edema   Cellulitis -ancef IV-- change to keflex 250mg  PO BID in AM -duplex LE pending  Hyperkalemia -resolved  CKD -unsure of baseline  Hypothyroidism Cont levothyroxine Tsh: mildly elevated -check free t4  R knee pain MRI knee  Hyperlipidemia Cont lovastatin   PT eval-- in wheelchair-- does do transfers    DVT prophylaxis:  Lovenox   Code Status: Full Code   Family Communication:   Disposition Plan:     Consultants:  Ortho   Subjective: Legs feeling better  Objective: Vitals:   12/18/16 2001 12/18/16 2230 12/19/16 0454 12/19/16 1402  BP:  110/70 106/60 98/62  Pulse: 98 68 70 65  Resp: 18 18 18 18   Temp:  98.5 F (36.9 C) 97.9 F (36.6 C) (!) 97.5 F (36.4 C)  TempSrc:  Oral Oral Oral  SpO2: 94% 95% 96% 95%  Weight:   74.9 kg (165 lb 3.2 oz)   Height:        Intake/Output Summary (Last 24 hours) at 12/19/2016 1416 Last data filed at 12/19/2016 1411 Gross per 24 hour  Intake 690 ml  Output 275 ml  Net 415 ml   Filed Weights   12/18/16 1026 12/19/16 0454  Weight: 73 kg (160 lb 15 oz) 74.9 kg (165 lb 3.2 oz)    Examination:  General exam: NAD  Respiratory system: clear Cardiovascular system: irr at times Gastrointestinal system: +BS, soft Central nervous  system: Alert and oriented Skin: large bruising on right leg with b/l chronic changes-- less weeping in LE     Data Reviewed: I have personally reviewed following labs and imaging studies  CBC: Recent Labs  Lab 12/17/16 1658 12/18/16 0820 12/19/16 0418  WBC 9.5 9.2 8.2  NEUTROABS 6.3  --   --   HGB 11.0* 10.8* 10.3*  HCT 35.0* 34.2* 32.5*  MCV 98.0 96.1 96.4  PLT 172 171 162   Basic Metabolic Panel: Recent Labs  Lab 12/17/16 1658 12/18/16 0820 12/19/16 0418  NA 139 138 134*  K 5.4* 4.0 3.5  CL 106 106 98*  CO2 24 22 23   GLUCOSE 87 97 86  BUN 41* 40* 37*  CREATININE 2.05* 1.86* 1.96*  CALCIUM 9.1 8.9 8.4*   GFR: Estimated Creatinine Clearance: 20.9 mL/min (A) (by C-G formula based on SCr of 1.96 mg/dL (H)). Liver Function Tests: Recent Labs  Lab 12/17/16 1658 12/18/16 0820  AST 31 40  ALT 33 20  ALKPHOS 83 96  BILITOT 0.4 1.1  PROT 7.5 7.0  ALBUMIN 3.3* 3.1*   No results for input(s): LIPASE, AMYLASE in the last 168 hours. No results for input(s): AMMONIA in the last 168 hours. Coagulation Profile: No results for input(s): INR, PROTIME in the last 168 hours. Cardiac Enzymes: Recent  Labs  Lab 12/18/16 0501 12/18/16 0820 12/18/16 1609 12/19/16 0823  TROPONINI 0.03* 0.04* 0.09* 0.08*   BNP (last 3 results) No results for input(s): PROBNP in the last 8760 hours. HbA1C: No results for input(s): HGBA1C in the last 72 hours. CBG: No results for input(s): GLUCAP in the last 168 hours. Lipid Profile: No results for input(s): CHOL, HDL, LDLCALC, TRIG, CHOLHDL, LDLDIRECT in the last 72 hours. Thyroid Function Tests: Recent Labs    12/18/16 0501  TSH 6.179*   Anemia Panel: No results for input(s): VITAMINB12, FOLATE, FERRITIN, TIBC, IRON, RETICCTPCT in the last 72 hours. Urine analysis:    Component Value Date/Time   COLORURINE YELLOW 12/17/2016 1854   APPEARANCEUR HAZY (A) 12/17/2016 1854   LABSPEC 1.013 12/17/2016 1854   PHURINE 5.0 12/17/2016  1854   GLUCOSEU NEGATIVE 12/17/2016 1854   HGBUR NEGATIVE 12/17/2016 1854   BILIRUBINUR NEGATIVE 12/17/2016 1854   KETONESUR NEGATIVE 12/17/2016 1854   PROTEINUR NEGATIVE 12/17/2016 1854   NITRITE NEGATIVE 12/17/2016 1854   LEUKOCYTESUR TRACE (A) 12/17/2016 1854     )No results found for this or any previous visit (from the past 240 hour(s)).    Anti-infectives (From admission, onward)   Start     Dose/Rate Route Frequency Ordered Stop   12/18/16 1100  ceFAZolin (ANCEF) IVPB 1 g/50 mL premix     1 g 100 mL/hr over 30 Minutes Intravenous Every 12 hours 12/18/16 1050     12/18/16 0900  cefTRIAXone (ROCEPHIN) 1 g in dextrose 5 % 50 mL IVPB  Status:  Discontinued     1 g 100 mL/hr over 30 Minutes Intravenous Every 24 hours 12/18/16 0804 12/18/16 1050       Radiology Studies: Dg Chest 2 View  Result Date: 12/18/2016 CLINICAL DATA:  83 year old female with fall and right lower extremity pain. EXAM: CHEST  2 VIEW COMPARISON:  None. FINDINGS: There is mild cardiomegaly. There is atherosclerotic calcification of the aortic arch. There is no focal consolidation, pleural effusion, or pneumothorax. Degenerative changes of the shoulders an spine. Age indeterminate, possibly chronic midthoracic compression deformity with anterior wedging. Correlation with clinical exam and point tenderness recommended. IMPRESSION: 1. No acute cardiopulmonary process. 2. Age indeterminate compression deformity of the midthoracic vertebra, possibly chronic. Correlation with point tenderness recommended. Electronically Signed   By: Elgie CollardArash  Radparvar M.D.   On: 12/18/2016 00:30   Dg Tibia/fibula Right  Result Date: 12/18/2016 CLINICAL DATA:  83 year old female with fall and right lower extremity pain. EXAM: RIGHT TIBIA AND FIBULA - 2 VIEW COMPARISON:  None. FINDINGS: There is severe osteoarthritic changes of the knee with narrowing of the medial and lateral compartments and bone spurring. There is sclerotic changes of  the medial tibial plateau. An age indeterminate minimally depressed fracture of the medial tibial plateau is not excluded. Correlation with clinical exam and point tenderness recommended. MRI may provide better evaluation if clinically indicated. No other acute fracture identified. There is no dislocation. The bones are osteopenic. There is mild soft tissue swelling anterior to the proximal tibia. There is atherosclerotic calcification of the vasculature. Mild diffuse subcutaneous edema. IMPRESSION: Severe osteoarthritic changes of the knee with narrowing of the medial and lateral compartment and possible associated age indeterminate compression of the medial tibial plateau. MRI may provide better evaluation if clinically indicated. No other acute fracture identified. No dislocation. Electronically Signed   By: Elgie CollardArash  Radparvar M.D.   On: 12/18/2016 00:38   Ct Knee Right Wo Contrast  Result Date: 12/18/2016  CLINICAL DATA:  83 year old female fell Saturday and presents with right knee pain and swelling. EXAM: CT OF THE RIGHT KNEE WITHOUT CONTRAST TECHNIQUE: Multidetector CT imaging of the RIGHT knee was performed according to the standard protocol. Multiplanar CT image reconstructions were also generated. COMPARISON:  12/17/2016 tibia and fibula radiographs. FINDINGS: Bones/Joint/Cartilage Motion related artifacts are believed to account for the offset of the distal femoral metadiaphysis on the sagittal reconstructions. No apparent fracture is noted on the coronal nor scout images in this region. If the patient does have pain or focal to the distal femoral shaft, dedicated femoral radiographs may prove useful. Osteoarthritic joint space narrowing of the femorotibial and patellofemoral compartments with subchondral cystic and sclerotic changes more so along the medial femorotibial compartment. Minimal spurring is noted of the tibial plateaus. No suspicious osseous lesions. No joint effusion. Ligaments Suboptimally  assessed by CT. Muscles and Tendons No intramuscular hemorrhage. Mild generalized intramuscular atrophy. Soft tissues Soft tissue contusion along the lateral aspect of the knee. IMPRESSION: 1. Tricompartmental osteoarthritis of the right knee. 2. No lipohemarthrosis nor findings to suggest an acute fracture. 3. On the sagittal reformatted images, there is offset of the distal metadiaphyseal cortex of the femur believed to be due to motion related artifacts. Dedicated femoral radiographs may help confirm that this is due to motion artifact and not a fracture as clinically fell warranted. Electronically Signed   By: Tollie Eth M.D.   On: 12/18/2016 13:22        Scheduled Meds: . acetaminophen  650 mg Oral QHS  . albuterol  3 mL Inhalation BID  . aspirin  81 mg Oral Daily  . cholecalciferol  1,000 Units Oral Daily  . clopidogrel  75 mg Oral Daily  . diclofenac sodium  4 g Topical TID  . donepezil  10 mg Oral QHS  . enoxaparin (LOVENOX) injection  30 mg Subcutaneous Q24H  . ferrous sulfate  325 mg Oral BID WC  . furosemide  20 mg Oral Daily  . gabapentin  200 mg Oral TID  . isosorbide mononitrate  30 mg Oral Daily  . levothyroxine  75 mcg Oral QAC breakfast  . lisinopril  2.5 mg Oral Daily  . loratadine  10 mg Oral Daily  . metoprolol tartrate  25 mg Oral BID  . pantoprazole  40 mg Oral Daily  . pravastatin  40 mg Oral QHS  . sodium chloride flush  3 mL Intravenous Q12H  . venlafaxine XR  150 mg Oral Q breakfast  . vitamin B-12  1,000 mcg Oral Daily   Continuous Infusions: . sodium chloride    .  ceFAZolin (ANCEF) IV Stopped (12/19/16 0926)     LOS: 1 day    Time spent: 35 min    Joseph Art, DO [facility] Pager [phone]  If 7PM-7AM, please contact night-coverage [email or web address] Password [email or web address] 12/19/2016, 2:16 PM

## 2016-12-20 DIAGNOSIS — L03115 Cellulitis of right lower limb: Secondary | ICD-10-CM

## 2016-12-20 LAB — CBC
HEMATOCRIT: 33.6 % — AB (ref 36.0–46.0)
Hemoglobin: 10.6 g/dL — ABNORMAL LOW (ref 12.0–15.0)
MCH: 30.6 pg (ref 26.0–34.0)
MCHC: 31.5 g/dL (ref 30.0–36.0)
MCV: 97.1 fL (ref 78.0–100.0)
PLATELETS: 177 10*3/uL (ref 150–400)
RBC: 3.46 MIL/uL — AB (ref 3.87–5.11)
RDW: 18.9 % — ABNORMAL HIGH (ref 11.5–15.5)
WBC: 7.7 10*3/uL (ref 4.0–10.5)

## 2016-12-20 LAB — BASIC METABOLIC PANEL
Anion gap: 12 (ref 5–15)
BUN: 32 mg/dL — ABNORMAL HIGH (ref 6–20)
CHLORIDE: 99 mmol/L — AB (ref 101–111)
CO2: 25 mmol/L (ref 22–32)
CREATININE: 1.87 mg/dL — AB (ref 0.44–1.00)
Calcium: 8.8 mg/dL — ABNORMAL LOW (ref 8.9–10.3)
GFR, EST AFRICAN AMERICAN: 27 mL/min — AB (ref >60)
GFR, EST NON AFRICAN AMERICAN: 23 mL/min — AB (ref >60)
Glucose, Bld: 92 mg/dL (ref 65–99)
POTASSIUM: 3.9 mmol/L (ref 3.5–5.1)
SODIUM: 136 mmol/L (ref 135–145)

## 2016-12-20 LAB — T4, FREE: Free T4: 0.94 ng/dL (ref 0.61–1.12)

## 2016-12-20 MED ORDER — METHOCARBAMOL 500 MG PO TABS: 500.0000 mg | ORAL_TABLET | Freq: Four times a day (QID) | ORAL | 0 refills | Status: AC | PRN

## 2016-12-20 MED ORDER — CEPHALEXIN 250 MG PO CAPS: 250.0000 mg | ORAL_CAPSULE | Freq: Two times a day (BID) | ORAL | 0 refills | Status: AC

## 2016-12-20 MED ORDER — CEPHALEXIN 250 MG PO CAPS
250.0000 mg | ORAL_CAPSULE | Freq: Two times a day (BID) | ORAL | Status: DC
  Administered 2016-12-20: 250 mg via ORAL
  Filled 2016-12-20 (×2): qty 1

## 2016-12-20 NOTE — Progress Notes (Signed)
[facility] Anticipated DC date: 12/20/16 Family notified: Daughter Transport by: Sharin MonsPTAR   Per MD patient ready for DC to ALF. RN, patient, patient's family, and facility notified of DC. Discharge Summary sent to facility. RN given number for report [phone]). DC packet on chart. Ambulance transport requested for patient.   CSW signing off.  Cristobal GoldmannNadia Dallyn Bergland, [place] Clinical Social Worker [phone]

## 2016-12-20 NOTE — Evaluation (Signed)
Physical Therapy Evaluation Patient Details Name: Sheila Barnes MRN: [id number] DOB: [birth year] Today's Date: 12/20/2016   History of Present Illness  Sheila Barnes  is a 83 y.o. female, w CKD, (uncertain baseline creatinine, CHF(unknownEF)  , has had cellulitis tx 10/9 w doxycycline at [facility] urgent care and then prescribed a 2nd round of doxycycline by PA .  Family noted that she fell on Friday , she was leaning forward to get something and fell forward.  Landed on knee.  Pt notes redness has been getting worse over the past few days.  Clinical Impression  Patient presents with decreased independence/safety with mobility due to painful R LE and generalized weakness.  Feel she will benefit from follow up [place] at d/c, but safe to return to ALF where daughter reports staff can assist.  Feel best for medical transport and daughter in agreement. Will sign off due to likely d/c today.     Follow Up Recommendations Home health PT;Supervision for mobility/OOB    Equipment Recommendations  None recommended by PT    Recommendations for Other Services       Precautions / Restrictions Precautions Precautions: Fall Restrictions Weight Bearing Restrictions: No      Mobility  Bed Mobility Overal bed mobility: Needs Assistance Bed Mobility: Supine to Sit;Sit to Supine     Supine to sit: HOB elevated;Supervision Sit to supine: Supervision   General bed mobility comments: use of rail to come up to sit and increased time.  to supine able to get legs into bed and used rail and bed in trendelenberg to scoot up to [facility]  Transfers Overall transfer level: Needs assistance   Transfers: Stand Pivot Transfers   Stand pivot transfers: Min assist;Mod assist       General transfer comment: used stedy to simulate pole pt uses to transfer at home (stedy backwards so pt could use horizontal bar to pull up and take pivotal steps to [facility] and back to bed) on the way over min A for safety, back to bed  mod A due to pt having to sit prior to getting legs backed up to bed and sat on edge of bed  Ambulation/Gait                Stairs            Wheelchair Mobility    Modified Rankin (Stroke Patients Only)       Balance Overall balance assessment: Needs assistance Sitting-balance support: Feet supported Sitting balance-Leahy Scale: Fair Sitting balance - Comments: static sitting without UE support, but reports has fallen when reaching from w/c for items and at times w/c not locked   Standing balance support: Bilateral upper extremity supported Standing balance-Leahy Scale: Poor Standing balance comment: holding onto something whenever up on her feet                             Pertinent Vitals/Pain Pain Assessment: Faces Faces Pain Scale: Hurts whole lot Pain Location: R leg after mobilizing Pain Descriptors / Indicators: Aching;Sore;Tender Pain Intervention(s): Repositioned;Monitored during session;Patient requesting pain meds-RN notified    Home Living Family/patient expects to be discharged to:: Assisted living([name] [place])               Home Equipment: Wheelchair - manual;Other (comment)(transfer pole)      Prior Function Level of Independence: Needs assistance;Independent with assistive device(s)   Gait / Transfers Assistance Needed: transfers to wheelchair  and bed with pole on her own; propels w/c with arms and feet  ADL's / Homemaking Assistance Needed: assist for some ADL's        Hand Dominance        Extremity/Trunk Assessment        Lower Extremity Assessment Lower Extremity Assessment: RLE deficits/detail;LLE deficits/detail RLE Deficits / Details: knee flexion contractures due to arthritis per daughter about 30 degrees bilaterally; R LE with extensive ecchymosis and sores on leg from fall and cellulitis; audible crepitus in knees when transferring LLE Deficits / Details: knee flexion contractures due  to arthritis per daughter about 30 degrees bilaterally    Cervical / Trunk Assessment Cervical / Trunk Assessment: Kyphotic;Other exceptions Cervical / Trunk Exceptions: kyphoscoliosis  Communication   Communication: No difficulties  Cognition Arousal/Alertness: Awake/alert Behavior During Therapy: WFL for tasks assessed/performed Overall Cognitive Status: History of cognitive impairments - at baseline                                        General Comments General comments (skin integrity, edema, etc.): daughter in room and eager for pt to return to [facility] as she feels she maintains independence better there with set up in her room with transfer pole.  Discussed need for staff assist any time getting up to w/c or to [facility] or going back to bed.  Also discussed HHPT.    Exercises     Assessment/Plan    PT Assessment All further PT needs can be met in the next venue of care  PT Problem List         PT Treatment Interventions      PT Goals (Current goals can be found in the Care Plan section)  Acute Rehab PT Goals PT Goal Formulation: All assessment and education complete, DC therapy    Frequency     Barriers to discharge        Co-evaluation               AM-PAC PT "6 Clicks" Daily Activity  Outcome Measure                  End of Session   Activity Tolerance: Patient limited by fatigue Patient left: in bed;with call bell/phone within reach;with family/visitor present Nurse Communication: Patient requests pain meds PT Visit Diagnosis: Muscle weakness (generalized) (M62.81);History of falling (Z91.81);Other symptoms and signs involving the nervous system (R29.898)    Time: 7341-9379 PT Time Calculation (min) (ACUTE ONLY): 45 min   Charges:   PT Evaluation $PT Eval Moderate Complexity: 1 Mod PT Treatments $Therapeutic Activity: 23-37 mins   PT G CodesMagda Barnes, [place] [phone] 12/20/2016   Sheila Barnes 12/20/2016, 1:34 PM

## 2016-12-20 NOTE — Discharge Summary (Signed)
Physician Discharge Summary  Chrishonda Hesch ZOX:[id number] DOB: [birth year] DOA: 12/17/2016  PCP: Patient, No Pcp Per  Admit date: 12/17/2016 Discharge date: 12/20/2016   Recommendations for Outpatient Follow-Up:   1. Daughter to establish with PCP 2. Keflex  3. Elevated LE 4. Home health 5. Consider palliative care referral-- overall poor prognosis   Discharge Diagnosis:   Principal Problem:   Cellulitis Active Problems:   Hyperkalemia   CKD (chronic kidney disease)   Edema   Discharge disposition:  ALF  Discharge Condition: Improved.  Diet recommendation: Low sodium, heart healthy.  Carbohydrate-modified.  Regular.  Wound care: None.   History of Present Illness:   BeverlyHandleyis a83 y.o.female,w CKD, (uncertain baseline creatinine, CHF(unknownEF) , has had cellulitis tx 10/9 w doxycycline at [facility] urgent care and then prescribed a 2nd round of doxycycline by PA . Family noted that she fell on Friday , she was leaning forward to get something and fell forward. Landed on knee. Pt notes redness has been getting worse over the past few days.     Hospital Course by Problem:   Cellulitis -ancef IV-- change to keflex 250mg  PO BID in AM -duplex LE unable to have full scan-- what was seen did not have blood clots -patient sits in wheelchair with legs dangling-- needs to elevate extremities Unable to do ABIs due to pain -ortho- WBAT  Hyperkalemia -resolved  CKD -unsure of baseline -continue lasix  Hypothyroidism Cont levothyroxine Tsh: mildly elevated -free t4 normal  Hyperlipidemia Cont lovastatin  PT eval-- in wheelchair-- does do transfers-- home health      Medical Consultants:    ortho   Discharge Exam:   Vitals:   12/20/16 0812 12/20/16 0943  BP:  113/75  Pulse:  60  Resp:    Temp:    SpO2: 99%    Vitals:   12/20/16 0459 12/20/16 0504 12/20/16 0812 12/20/16 0943  BP:  138/84  113/75  Pulse:  64  60  Resp:   17    Temp:  (!) 97.5 F (36.4 C)    TempSrc:  Oral    SpO2:  98% 99%   Weight: 80.7 kg (178 lb)     Height:           The results of significant diagnostics from this hospitalization (including imaging, microbiology, ancillary and laboratory) are listed below for reference.     Procedures and Diagnostic Studies:   Dg Chest 2 View  Result Date: 12/18/2016 CLINICAL DATA:  83 year old female with fall and right lower extremity pain. EXAM: CHEST  2 VIEW COMPARISON:  None. FINDINGS: There is mild cardiomegaly. There is atherosclerotic calcification of the aortic arch. There is no focal consolidation, pleural effusion, or pneumothorax. Degenerative changes of the shoulders an spine. Age indeterminate, possibly chronic midthoracic compression deformity with anterior wedging. Correlation with clinical exam and point tenderness recommended. IMPRESSION: 1. No acute cardiopulmonary process. 2. Age indeterminate compression deformity of the midthoracic vertebra, possibly chronic. Correlation with point tenderness recommended. Electronically Signed   By: Elgie Collard M.D.   On: 12/18/2016 00:30   Dg Tibia/fibula Right  Result Date: 12/18/2016 CLINICAL DATA:  83 year old female with fall and right lower extremity pain. EXAM: RIGHT TIBIA AND FIBULA - 2 VIEW COMPARISON:  None. FINDINGS: There is severe osteoarthritic changes of the knee with narrowing of the medial and lateral compartments and bone spurring. There is sclerotic changes of the medial tibial plateau. An age indeterminate minimally depressed fracture of the medial tibial plateau is  not excluded. Correlation with clinical exam and point tenderness recommended. MRI may provide better evaluation if clinically indicated. No other acute fracture identified. There is no dislocation. The bones are osteopenic. There is mild soft tissue swelling anterior to the proximal tibia. There is atherosclerotic calcification of the vasculature. Mild diffuse  subcutaneous edema. IMPRESSION: Severe osteoarthritic changes of the knee with narrowing of the medial and lateral compartment and possible associated age indeterminate compression of the medial tibial plateau. MRI may provide better evaluation if clinically indicated. No other acute fracture identified. No dislocation. Electronically Signed   By: Elgie Collard M.D.   On: 12/18/2016 00:38   Ct Knee Right Wo Contrast  Result Date: 12/18/2016 CLINICAL DATA:  83 year old female fell Saturday and presents with right knee pain and swelling. EXAM: CT OF THE RIGHT KNEE WITHOUT CONTRAST TECHNIQUE: Multidetector CT imaging of the RIGHT knee was performed according to the standard protocol. Multiplanar CT image reconstructions were also generated. COMPARISON:  12/17/2016 tibia and fibula radiographs. FINDINGS: Bones/Joint/Cartilage Motion related artifacts are believed to account for the offset of the distal femoral metadiaphysis on the sagittal reconstructions. No apparent fracture is noted on the coronal nor scout images in this region. If the patient does have pain or focal to the distal femoral shaft, dedicated femoral radiographs may prove useful. Osteoarthritic joint space narrowing of the femorotibial and patellofemoral compartments with subchondral cystic and sclerotic changes more so along the medial femorotibial compartment. Minimal spurring is noted of the tibial plateaus. No suspicious osseous lesions. No joint effusion. Ligaments Suboptimally assessed by CT. Muscles and Tendons No intramuscular hemorrhage. Mild generalized intramuscular atrophy. Soft tissues Soft tissue contusion along the lateral aspect of the knee. IMPRESSION: 1. Tricompartmental osteoarthritis of the right knee. 2. No lipohemarthrosis nor findings to suggest an acute fracture. 3. On the sagittal reformatted images, there is offset of the distal metadiaphyseal cortex of the femur believed to be due to motion related artifacts. Dedicated  femoral radiographs may help confirm that this is due to motion artifact and not a fracture as clinically fell warranted. Electronically Signed   By: Tollie Eth M.D.   On: 12/18/2016 13:22     Labs:   Basic Metabolic Panel: Recent Labs  Lab 12/17/16 1658 12/18/16 0820 12/19/16 0418 12/20/16 0507  NA 139 138 134* 136  K 5.4* 4.0 3.5 3.9  CL 106 106 98* 99*  CO2 24 22 23 25   GLUCOSE 87 97 86 92  BUN 41* 40* 37* 32*  CREATININE 2.05* 1.86* 1.96* 1.87*  CALCIUM 9.1 8.9 8.4* 8.8*   GFR Estimated Creatinine Clearance: 22.7 mL/min (A) (by C-G formula based on SCr of 1.87 mg/dL (H)). Liver Function Tests: Recent Labs  Lab 12/17/16 1658 12/18/16 0820  AST 31 40  ALT 33 20  ALKPHOS 83 96  BILITOT 0.4 1.1  PROT 7.5 7.0  ALBUMIN 3.3* 3.1*   No results for input(s): LIPASE, AMYLASE in the last 168 hours. No results for input(s): AMMONIA in the last 168 hours. Coagulation profile No results for input(s): INR, PROTIME in the last 168 hours.  CBC: Recent Labs  Lab 12/17/16 1658 12/18/16 0820 12/19/16 0418 12/20/16 0507  WBC 9.5 9.2 8.2 7.7  NEUTROABS 6.3  --   --   --   HGB 11.0* 10.8* 10.3* 10.6*  HCT 35.0* 34.2* 32.5* 33.6*  MCV 98.0 96.1 96.4 97.1  PLT 172 171 162 177   Cardiac Enzymes: Recent Labs  Lab 12/18/16 0501 12/18/16 0820 12/18/16 1609  12/19/16 0823  TROPONINI 0.03* 0.04* 0.09* 0.08*   BNP: Invalid input(s): POCBNP CBG: No results for input(s): GLUCAP in the last 168 hours. D-Dimer No results for input(s): DDIMER in the last 72 hours. Hgb A1c No results for input(s): HGBA1C in the last 72 hours. Lipid Profile No results for input(s): CHOL, HDL, LDLCALC, TRIG, CHOLHDL, LDLDIRECT in the last 72 hours. Thyroid function studies Recent Labs    12/18/16 0501  TSH 6.179*   Anemia work up No results for input(s): VITAMINB12, FOLATE, FERRITIN, TIBC, IRON, RETICCTPCT in the last 72 hours. Microbiology No results found for this or any previous  visit (from the past 240 hour(s)).   Discharge Instructions:    Allergies as of 12/20/2016      Reactions   Nsaids    Pt can't have   Prednisone    Blood infection      Medication List    STOP taking these medications   doxycycline 100 MG tablet Commonly known as:  VIBRA-TABS   metoprolol succinate 25 MG 24 hr tablet Commonly known as:  TOPROL-XL   polymixin-bacitracin 500-10000 UNIT/GM Oint ointment     TAKE these medications   acetaminophen 325 MG tablet Commonly known as:  TYLENOL Take 650 mg by mouth at bedtime.   acetaminophen 325 MG tablet Commonly known as:  TYLENOL Take 650 mg by mouth every 6 (six) hours as needed for mild pain, moderate pain or fever.   aspirin 81 MG tablet Take 81 mg by mouth daily.   cephALEXin 250 MG capsule Commonly known as:  KEFLEX Take 1 capsule (250 mg total) by mouth every 12 (twelve) hours.   cholecalciferol 1000 units tablet Commonly known as:  VITAMIN D Take 1,000 Units by mouth daily.   clopidogrel 75 MG tablet Commonly known as:  PLAVIX Take 75 mg by mouth daily.   diclofenac sodium 1 % Gel Commonly known as:  VOLTAREN Apply 4 g topically 3 (three) times daily.   docusate sodium 250 MG capsule Commonly known as:  COLACE Take 250 mg by mouth daily as needed for constipation.   donepezil 10 MG tablet Commonly known as:  ARICEPT Take 10 mg by mouth at bedtime.   ferrous sulfate 325 (65 FE) MG tablet Take 325 mg by mouth 2 (two) times daily with a meal.   furosemide 40 MG tablet Commonly known as:  LASIX Take 20 mg by mouth daily.   gabapentin 100 MG capsule Commonly known as:  NEURONTIN Take 200 mg by mouth 3 (three) times daily.   GERI-LANTA 200-200-20 MG/5ML suspension Generic drug:  alum & mag hydroxide-simeth Take 30 mLs by mouth every 6 (six) hours as needed for indigestion or heartburn.   ICAPS MV PO Take 1 capsule by mouth daily.   isosorbide mononitrate 30 MG 24 hr tablet Commonly known as:   IMDUR Take 30 mg by mouth daily.   levothyroxine 75 MCG tablet Commonly known as:  SYNTHROID, LEVOTHROID Take 75 mcg by mouth daily before breakfast.   lisinopril 2.5 MG tablet Commonly known as:  PRINIVIL,ZESTRIL Take 2.5 mg by mouth daily.   loperamide 2 MG tablet Commonly known as:  IMODIUM A-D Take 2 mg by mouth 4 (four) times daily as needed for diarrhea or loose stools.   loratadine 10 MG tablet Commonly known as:  CLARITIN Take 10 mg by mouth daily.   lovastatin 40 MG 24 hr tablet Commonly known as:  ALTOPREV Take 40 mg by mouth at bedtime.  magnesium hydroxide 400 MG/5ML suspension Commonly known as:  MILK OF MAGNESIA Take 30 mLs by mouth daily as needed for mild constipation.   methocarbamol 500 MG tablet Commonly known as:  ROBAXIN Take 1 tablet (500 mg total) by mouth every 6 (six) hours as needed for muscle spasms.   metoprolol tartrate 25 MG tablet Commonly known as:  LOPRESSOR Take 25 mg by mouth 2 (two) times daily.   nitroGLYCERIN 0.4 MG SL tablet Commonly known as:  NITROSTAT Place 0.4 mg under the tongue every 5 (five) minutes as needed for chest pain.   nystatin powder Commonly known as:  MYCOSTATIN/NYSTOP Apply topically daily as needed (to rash).   pantoprazole 40 MG tablet Commonly known as:  PROTONIX Take 40 mg by mouth daily.   polyethylene glycol packet Commonly known as:  MIRALAX / GLYCOLAX Take 17 g by mouth daily as needed for mild constipation.   PROAIR HFA 108 (90 Base) MCG/ACT inhaler Generic drug:  albuterol Inhale 2 puffs into the lungs 2 (two) times daily.   REFRESH TEARS 0.5 % Soln Generic drug:  carboxymethylcellulose Place 1 drop into both eyes daily as needed (for dry eyes). What changed:  Another medication with the same name was removed. Continue taking this medication, and follow the directions you see here.   traMADol 50 MG tablet Commonly known as:  ULTRAM Take 50 mg by mouth 2 (two) times daily as needed for  moderate pain.   venlafaxine XR 150 MG 24 hr capsule Commonly known as:  EFFEXOR-XR Take 150 mg by mouth daily with breakfast.   vitamin B-12 1000 MCG tablet Commonly known as:  CYANOCOBALAMIN Take 1,000 mcg by mouth daily.         Time coordinating discharge: 35 min  Signed:  Joseph ArtJessica U Starlina Lapre   [facility] 12/20/2016, 10:23 AM

## 2016-12-20 NOTE — Discharge Instructions (Signed)
Keep LE elevated

## 2016-12-20 NOTE — NC FL2 (Signed)
[place] MEDICAID FL2 LEVEL OF CARE SCREENING TOOL     IDENTIFICATION  Patient Name: Sheila Barnes Birthdate: 09/24/1927 Sex: female Admission Date (Current Location): 12/17/2016  [facility] [place]   Facility and Address:  [facility] [place], [address], [place], [place] [address]      Provider Number: [id number]  Attending Physician Name and Address:  Sheila Barnes, Sheila Barnes  Relative Name and Phone Number:  [place], daughter, [phone]    Current Level of Care: Hospital Recommended Level of Care: Assisted Living Facility Prior Approval Number:    Date Approved/Denied:   PASRR Number:    Discharge Plan: Other (Comment)(ALF)    Current Diagnoses: Patient Active Problem List   Diagnosis Date Noted  . Cellulitis 12/18/2016  . Hyperkalemia 12/18/2016  . CKD (chronic kidney disease) 12/18/2016  . Edema 12/18/2016    Orientation RESPIRATION BLADDER Height & Weight     Self, Place  Normal Incontinent Weight: 80.7 kg (178 lb)(taken on bed) Height:  5\' 6"  (167.6 cm)  BEHAVIORAL SYMPTOMS/MOOD NEUROLOGICAL BOWEL NUTRITION STATUS      Continent Diet(Regular)  AMBULATORY STATUS COMMUNICATION OF NEEDS Skin   Limited Assist Verbally Normal                       Personal Care Assistance Level of Assistance  Bathing, Feeding, Dressing Bathing Assistance: Maximum assistance Feeding assistance: Independent Dressing Assistance: Limited assistance     Functional Limitations Info             SPECIAL CARE FACTORS FREQUENCY  PT (By licensed PT)     PT Frequency: 5x/week              Contractures      Additional Factors Info  Code Status, Allergies, Psychotropic Code Status Info: Full Allergies Info: Nsaids, Prednisone Psychotropic Info: Effexor         Current Medications (12/20/2016):    Discharge Medications: STOP taking these medications   doxycycline 100 MG tablet Commonly known as:  VIBRA-TABS    metoprolol succinate 25 MG 24 hr tablet Commonly known as:  TOPROL-XL   polymixin-bacitracin 500-10000 UNIT/GM Oint ointment     TAKE these medications   acetaminophen 325 MG tablet Commonly known as:  TYLENOL Take 650 mg by mouth at bedtime.   acetaminophen 325 MG tablet Commonly known as:  TYLENOL Take 650 mg by mouth every 6 (six) hours as needed for mild pain, moderate pain or fever.   aspirin 81 MG tablet Take 81 mg by mouth daily.   cephALEXin 250 MG capsule Commonly known as:  KEFLEX Take 1 capsule (250 mg total) by mouth every 12 (twelve) hours.   cholecalciferol 1000 units tablet Commonly known as:  VITAMIN D Take 1,000 Units by mouth daily.   clopidogrel 75 MG tablet Commonly known as:  PLAVIX Take 75 mg by mouth daily.   diclofenac sodium 1 % Gel Commonly known as:  VOLTAREN Apply 4 g topically 3 (three) times daily.   docusate sodium 250 MG capsule Commonly known as:  COLACE Take 250 mg by mouth daily as needed for constipation.   donepezil 10 MG tablet Commonly known as:  ARICEPT Take 10 mg by mouth at bedtime.   ferrous sulfate 325 (65 FE) MG tablet Take 325 mg by mouth 2 (two) times daily with a meal.   furosemide 40 MG tablet Commonly known as:  LASIX Take 20 mg by mouth daily.  gabapentin 100 MG capsule Commonly known as:  NEURONTIN Take 200 mg by mouth 3 (three) times daily.   GERI-LANTA 200-200-20 MG/5ML suspension Generic drug:  alum & mag hydroxide-simeth Take 30 mLs by mouth every 6 (six) hours as needed for indigestion or heartburn.   ICAPS MV PO Take 1 capsule by mouth daily.   isosorbide mononitrate 30 MG 24 hr tablet Commonly known as:  IMDUR Take 30 mg by mouth daily.   levothyroxine 75 MCG tablet Commonly known as:  SYNTHROID, LEVOTHROID Take 75 mcg by mouth daily before breakfast.   lisinopril 2.5 MG tablet Commonly known as:  PRINIVIL,ZESTRIL Take 2.5 mg by mouth daily.   loperamide 2 MG  tablet Commonly known as:  IMODIUM A-D Take 2 mg by mouth 4 (four) times daily as needed for diarrhea or loose stools.   loratadine 10 MG tablet Commonly known as:  CLARITIN Take 10 mg by mouth daily.   lovastatin 40 MG 24 hr tablet Commonly known as:  ALTOPREV Take 40 mg by mouth at bedtime.   magnesium hydroxide 400 MG/5ML suspension Commonly known as:  MILK OF MAGNESIA Take 30 mLs by mouth daily as needed for mild constipation.   methocarbamol 500 MG tablet Commonly known as:  ROBAXIN Take 1 tablet (500 mg total) by mouth every 6 (six) hours as needed for muscle spasms.   metoprolol tartrate 25 MG tablet Commonly known as:  LOPRESSOR Take 25 mg by mouth 2 (two) times daily.   nitroGLYCERIN 0.4 MG SL tablet Commonly known as:  NITROSTAT Place 0.4 mg under the tongue every 5 (five) minutes as needed for chest pain.   nystatin powder Commonly known as:  MYCOSTATIN/NYSTOP Apply topically daily as needed (to rash).   pantoprazole 40 MG tablet Commonly known as:  PROTONIX Take 40 mg by mouth daily.   polyethylene glycol packet Commonly known as:  MIRALAX / GLYCOLAX Take 17 g by mouth daily as needed for mild constipation.   PROAIR HFA 108 (90 Base) MCG/ACT inhaler Generic drug:  albuterol Inhale 2 puffs into the lungs 2 (two) times daily.   REFRESH TEARS 0.5 % Soln Generic drug:  carboxymethylcellulose Place 1 drop into both eyes daily as needed (for dry eyes). What changed:  Another medication with the same name was removed. Continue taking this medication, and follow the directions you see here.   traMADol 50 MG tablet Commonly known as:  ULTRAM Take 50 mg by mouth 2 (two) times daily as needed for moderate pain.   venlafaxine XR 150 MG 24 hr capsule Commonly known as:  EFFEXOR-XR Take 150 mg by mouth daily with breakfast.   vitamin B-12 1000 MCG tablet Commonly known as:  CYANOCOBALAMIN Take 1,000 mcg by mouth daily.       Relevant  Imaging Results:  Relevant Lab Results:   Additional Information SSN: [id number]5328  Sheila Barnes, [place]

## 2016-12-20 NOTE — Clinical Social Work Note (Signed)
Clinical Social Work Assessment  Patient Details  Name: Sheila LarocheBeverly Barnes MRN: [id number] Date of Birth: 04/26/1927  Date of referral:  12/19/16               Reason for consult:  Discharge Planning                Permission sought to share information with:  Facility Medical sales representativeContact Representative, Family Supports Permission granted to share information::  No  Name::     [place]  Agency::  [facility]  Relationship::  Daughter  Contact Information:  [phone]  Housing/Transportation Living arrangements for the past 2 months:  Assisted Living Facility Source of Information:  Adult Children Patient Interpreter Needed:  None Criminal Activity/Legal Involvement Pertinent to Current Situation/Hospitalization:  No - Comment as needed Significant Relationships:  Adult Children Lives with:  Facility Resident Do you feel safe going back to the place where you live?  Yes Need for family participation in patient care:  Yes (Comment)  Care giving concerns: CSW received consult regarding discharge planning. CSW spoke with patient and her daughter at bedside. [facility] and will return at discharge. CSW continuing to follow for discharge needs.    Social Worker assessment / plan: Patient to return to ALF at discharge.   Employment status:  Retired Health and safety inspectornsurance information:  Medicare PT Recommendations:  Not assessed at this time Information / Referral to community resources:     Patient/Family's Response to care:  Patient's daughter reports agreement with discharge plan. She requests PTAR for transportation.   Patient/Family's Understanding of and Emotional Response to Diagnosis, Current Treatment, and Prognosis:  Patient/family is realistic regarding therapy needs and expressed being hopeful for return to ALF placement quickly. Patient and her daughter expressed understanding of CSW role and discharge process as well as medical condition. No questions/concerns  about plan or treatment.    Emotional Assessment Appearance:  Appears stated age Attitude/Demeanor/Rapport:  Unable to Assess Affect (typically observed):  Unable to Assess Orientation:  Oriented to Self, Oriented to Place Alcohol / Substance use:  Not Applicable Psych involvement (Current and /or in the community):  No (Comment)  Discharge Needs  Concerns to be addressed:  Care Coordination Readmission within the last 30 days:  No Current discharge risk:  None Barriers to Discharge:  Continued Medical Work up   [employer] Kilo Eshelman, LCSWA 12/20/2016, 10:51 AM

## 2016-12-31 ENCOUNTER — Other Ambulatory Visit: Payer: Self-pay

## 2016-12-31 ENCOUNTER — Emergency Department (HOSPITAL_BASED_OUTPATIENT_CLINIC_OR_DEPARTMENT_OTHER): Payer: Medicare Other

## 2016-12-31 ENCOUNTER — Encounter (HOSPITAL_BASED_OUTPATIENT_CLINIC_OR_DEPARTMENT_OTHER): Payer: Self-pay | Admitting: *Deleted

## 2016-12-31 ENCOUNTER — Emergency Department (HOSPITAL_BASED_OUTPATIENT_CLINIC_OR_DEPARTMENT_OTHER)
Admission: EM | Admit: 2016-12-31 | Discharge: 2016-12-31 | Disposition: A | Discharge status: Home / Self Care | Payer: Medicare Other | Attending: Physician Assistant | Admitting: Physician Assistant

## 2016-12-31 DIAGNOSIS — R600 Localized edema: Secondary | ICD-10-CM | POA: Insufficient documentation

## 2016-12-31 DIAGNOSIS — R410 Disorientation, unspecified: Secondary | ICD-10-CM | POA: Diagnosis not present

## 2016-12-31 DIAGNOSIS — I482 Chronic atrial fibrillation: Secondary | ICD-10-CM | POA: Insufficient documentation

## 2016-12-31 DIAGNOSIS — R238 Other skin changes: Secondary | ICD-10-CM | POA: Insufficient documentation

## 2016-12-31 DIAGNOSIS — Z87891 Personal history of nicotine dependence: Secondary | ICD-10-CM | POA: Diagnosis not present

## 2016-12-31 DIAGNOSIS — Z79899 Other long term (current) drug therapy: Secondary | ICD-10-CM | POA: Diagnosis not present

## 2016-12-31 DIAGNOSIS — I509 Heart failure, unspecified: Secondary | ICD-10-CM | POA: Diagnosis not present

## 2016-12-31 DIAGNOSIS — N189 Chronic kidney disease, unspecified: Secondary | ICD-10-CM | POA: Insufficient documentation

## 2016-12-31 DIAGNOSIS — Z7901 Long term (current) use of anticoagulants: Secondary | ICD-10-CM | POA: Diagnosis not present

## 2016-12-31 DIAGNOSIS — R001 Bradycardia, unspecified: Secondary | ICD-10-CM | POA: Insufficient documentation

## 2016-12-31 DIAGNOSIS — Z7982 Long term (current) use of aspirin: Secondary | ICD-10-CM | POA: Diagnosis not present

## 2016-12-31 DIAGNOSIS — R531 Weakness: Secondary | ICD-10-CM

## 2016-12-31 DIAGNOSIS — J45909 Unspecified asthma, uncomplicated: Secondary | ICD-10-CM | POA: Diagnosis not present

## 2016-12-31 LAB — CBC WITH DIFFERENTIAL/PLATELET
BASOS ABS: 0 10*3/uL (ref 0.0–0.1)
BASOS PCT: 0 %
EOS ABS: 0 10*3/uL (ref 0.0–0.7)
Eosinophils Relative: 0 %
HCT: 39.3 % (ref 36.0–46.0)
Hemoglobin: 12.1 g/dL (ref 12.0–15.0)
LYMPHS PCT: 4 %
Lymphs Abs: 1 10*3/uL (ref 0.7–4.0)
MCH: 31.6 pg (ref 26.0–34.0)
MCHC: 30.8 g/dL (ref 30.0–36.0)
MCV: 102.6 fL — ABNORMAL HIGH (ref 78.0–100.0)
Monocytes Absolute: 1.2 10*3/uL — ABNORMAL HIGH (ref 0.1–1.0)
Monocytes Relative: 5 %
NEUTROS PCT: 91 %
Neutro Abs: 21.9 10*3/uL — ABNORMAL HIGH (ref 1.7–7.7)
PLATELETS: 235 10*3/uL (ref 150–400)
RBC: 3.83 MIL/uL — AB (ref 3.87–5.11)
RDW: 20.4 % — ABNORMAL HIGH (ref 11.5–15.5)
WBC: 24.1 10*3/uL — AB (ref 4.0–10.5)

## 2016-12-31 LAB — I-STAT VENOUS BLOOD GAS, ED
ACID-BASE DEFICIT: 16 mmol/L — AB (ref 0.0–2.0)
BICARBONATE: 11.4 mmol/L — AB (ref 20.0–28.0)
O2 Saturation: 80 %
PO2 VEN: 53 mmHg — AB (ref 32.0–45.0)
TCO2: 12 mmol/L — AB (ref 22–32)
pCO2, Ven: 31.2 mmHg — ABNORMAL LOW (ref 44.0–60.0)
pH, Ven: 7.167 — CL (ref 7.250–7.430)

## 2016-12-31 LAB — URINALYSIS, ROUTINE W REFLEX MICROSCOPIC
GLUCOSE, UA: NEGATIVE mg/dL
HGB URINE DIPSTICK: NEGATIVE
KETONES UR: NEGATIVE mg/dL
Leukocytes, UA: NEGATIVE
Nitrite: NEGATIVE
PH: 5.5 (ref 5.0–8.0)
PROTEIN: NEGATIVE mg/dL
Specific Gravity, Urine: >1.03 — ABNORMAL HIGH (ref 1.005–1.030)

## 2016-12-31 LAB — I-STAT CG4 LACTIC ACID, ED: LACTIC ACID, VENOUS: 12.82 mmol/L — AB (ref 0.5–1.9)

## 2016-12-31 LAB — BRAIN NATRIURETIC PEPTIDE: B NATRIURETIC PEPTIDE 5: 928.4 pg/mL — AB (ref 0.0–100.0)

## 2016-12-31 LAB — TROPONIN I: TROPONIN I: 0.08 ng/mL — AB (ref <0.03)

## 2016-12-31 MED ORDER — PIPERACILLIN-TAZOBACTAM 3.375 G IVPB 30 MIN
3.3750 g | Freq: Once | INTRAVENOUS | Status: AC
  Administered 2016-12-31: 3.375 g via INTRAVENOUS
  Filled 2016-12-31 (×2): qty 50

## 2016-12-31 MED ORDER — NOREPINEPHRINE BITARTRATE 1 MG/ML IV SOLN
0.0000 ug/min | Freq: Once | INTRAVENOUS | Status: AC
  Administered 2016-12-31: 5 ug/min via INTRAVENOUS
  Filled 2016-12-31: qty 4

## 2016-12-31 MED ORDER — SODIUM CHLORIDE 0.9 % IV BOLUS (SEPSIS)
1000.0000 mL | Freq: Once | INTRAVENOUS | Status: AC
  Administered 2016-12-31: 1000 mL via INTRAVENOUS

## 2016-12-31 MED ORDER — VANCOMYCIN HCL IN DEXTROSE 1-5 GM/200ML-% IV SOLN
1000.0000 mg | Freq: Once | INTRAVENOUS | Status: AC
  Administered 2016-12-31: 1000 mg via INTRAVENOUS
  Filled 2016-12-31: qty 200

## 2016-12-31 MED ORDER — ATROPINE SULFATE 1 MG/10ML IJ SOSY
PREFILLED_SYRINGE | INTRAMUSCULAR | Status: AC
  Administered 2016-12-31: 0.5 mg
  Filled 2016-12-31: qty 10

## 2016-12-31 MED ORDER — MORPHINE SULFATE 15 MG PO TABS: 7.5000 mg | ORAL_TABLET | ORAL | 0 refills | Status: AC | PRN

## 2016-12-31 MED ORDER — MORPHINE SULFATE (PF) 2 MG/ML IV SOLN
2.0000 mg | Freq: Once | INTRAVENOUS | Status: AC
  Administered 2016-12-31: 2 mg via INTRAVENOUS
  Filled 2016-12-31: qty 1

## 2016-12-31 MED ORDER — LORAZEPAM 1 MG PO TABS: 0.5000 mg | ORAL_TABLET | ORAL | 0 refills | Status: AC

## 2016-12-31 MED ORDER — MORPHINE SULFATE 20 MG/5ML PO SOLN: 2.5000 mg | ORAL | 0 refills | Status: AC | PRN

## 2016-12-31 NOTE — ED Notes (Signed)
PT discharged home, [facility] She is awaiting arrival. Prescriptions given to family. [place].

## 2016-12-31 NOTE — ED Notes (Signed)
PT requesting mask to be taken off. Non-rebreather removed per request. Family at bedside agrees with care and plan. Dr. Corlis LeakMackuen updated.

## 2016-12-31 NOTE — ED Provider Notes (Addendum)
[facility] EMERGENCY DEPARTMENT Provider Note   CSN: [id number] Arrival date & time: 01/05/2017  1511     History   Chief Complaint Chief Complaint  Patient presents with  . Weakness    HPI Sheila Barnes is a 83 y.o. female.  HPI   Patient is an 83 year old female with past medical history significant for recent hospitalization for cellulitis.  Patient has a history of CHF, asthma, anemia, rheumatoid arthritis.  She has chronic A. Fib.  Patient feels "unwell".  Patient's daughter went to go visit her and knows that she did not look well, purple extremities.  Patient recent discharge from hospital with bilateral cellulitis lower lower extremities.  On arrival patient had initially normal vitals however appeared to have poor perfusion with mottled external extremities.  Past Medical History:  Diagnosis Date  . Allergy   . Anemia   . Asthma   . Blood transfusion without reported diagnosis   . Cataract   . CHF (congestive heart failure) (HCC)   . Chronic kidney disease   . Heart murmur   . Macular degeneration   . RA (rheumatoid arthritis) [facility](HCC)     Patient Active Problem List   Diagnosis Date Noted  . Cellulitis 12/18/2016  . Hyperkalemia 12/18/2016  . CKD (chronic kidney disease) 12/18/2016  . Edema 12/18/2016    Past Surgical History:  Procedure Laterality Date  . ABDOMINAL HYSTERECTOMY    . APPENDECTOMY    . CATARACT EXTRACTION    . HIP SURGERY    . SPINE SURGERY    . TONSILLECTOMY      OB History    No data available       Home Medications    Prior to Admission medications   Medication Sig Start Date End Date Taking? Authorizing Provider  acetaminophen (TYLENOL) 325 MG tablet Take 650 mg by mouth at bedtime.     [provider]  acetaminophen (TYLENOL) 325 MG tablet Take 650 mg by mouth every 6 (six) hours as needed for mild pain, moderate pain or fever.    [provider]  alum & mag hydroxide-simeth (GERI-LANTA)  200-200-20 MG/5ML suspension Take 30 mLs by mouth every 6 (six) hours as needed for indigestion or heartburn.    [provider]  aspirin 81 MG tablet Take 81 mg by mouth daily.    [provider]  carboxymethylcellulose (REFRESH TEARS) 0.5 % SOLN Place 1 drop into both eyes daily as needed (for dry eyes).    [provider]  cephALEXin (KEFLEX) 250 MG capsule Take 1 capsule (250 mg total) by mouth every 12 (twelve) hours. 12/20/16   Joseph ArtVann, Jessica U, DO  cholecalciferol (VITAMIN D) 1000 units tablet Take 1,000 Units by mouth daily.    [provider]  clopidogrel (PLAVIX) 75 MG tablet Take 75 mg by mouth daily.    [provider]  diclofenac sodium (VOLTAREN) 1 % GEL Apply 4 g topically 3 (three) times daily.     [provider]  docusate sodium (COLACE) 250 MG capsule Take 250 mg by mouth daily as needed for constipation.     [provider]  donepezil (ARICEPT) 10 MG tablet Take 10 mg by mouth at bedtime.    [provider]  ferrous sulfate 325 (65 FE) MG tablet Take 325 mg by mouth 2 (two) times daily with a meal.     [provider]  furosemide (LASIX) 40 MG tablet Take 20 mg by mouth daily.  [provider]  gabapentin (NEURONTIN) 100 MG capsule Take 200 mg by mouth 3 (three) times daily.     [provider]  isosorbide mononitrate (IMDUR) 30 MG 24 hr tablet Take 30 mg by mouth daily.    [provider]  levothyroxine (SYNTHROID, LEVOTHROID) 75 MCG tablet Take 75 mcg by mouth daily before breakfast.    [provider]  lisinopril (PRINIVIL,ZESTRIL) 2.5 MG tablet Take 2.5 mg by mouth daily.    [provider]  loperamide (IMODIUM A-D) 2 MG tablet Take 2 mg by mouth 4 (four) times daily as needed for diarrhea or loose stools.    [provider]  loratadine (CLARITIN) 10 MG tablet Take 10 mg by mouth daily.    [provider]  lovastatin (ALTOPREV) 40 MG  24 hr tablet Take 40 mg by mouth at bedtime.    [provider]  magnesium hydroxide (MILK OF MAGNESIA) 400 MG/5ML suspension Take 30 mLs by mouth daily as needed for mild constipation.    [provider]  methocarbamol (ROBAXIN) 500 MG tablet Take 1 tablet (500 mg total) by mouth every 6 (six) hours as needed for muscle spasms. 12/20/16   Joseph Art, DO  metoprolol tartrate (LOPRESSOR) 25 MG tablet Take 25 mg by mouth 2 (two) times daily.    [provider]  Multiple Vitamins-Minerals (ICAPS MV PO) Take 1 capsule by mouth daily.     [provider]  nitroGLYCERIN (NITROSTAT) 0.4 MG SL tablet Place 0.4 mg under the tongue every 5 (five) minutes as needed for chest pain.    [provider]  nystatin (MYCOSTATIN/NYSTOP) powder Apply topically daily as needed (to rash).    [provider]  pantoprazole (PROTONIX) 40 MG tablet Take 40 mg by mouth daily.    [provider]  polyethylene glycol (MIRALAX / GLYCOLAX) packet Take 17 g by mouth daily as needed for mild constipation.    [provider]  PROAIR HFA 108 [phone] Base) MCG/ACT inhaler Inhale 2 puffs into the lungs 2 (two) times daily.  12/13/16   [provider]  traMADol (ULTRAM) 50 MG tablet Take 50 mg by mouth 2 (two) times daily as needed for moderate pain.     [provider]  venlafaxine XR (EFFEXOR-XR) 150 MG 24 hr capsule Take 150 mg by mouth daily with breakfast.    [provider]  vitamin B-12 (CYANOCOBALAMIN) 1000 MCG tablet Take 1,000 mcg by mouth daily.    [provider]    Family History Family History  Problem Relation Age of Onset  . Cardiomyopathy Mother   . Heart failure Mother   . CAD Father     Social History Social History   Tobacco Use  . Smoking status: Former Games developer  . Smokeless tobacco: Never Used  Substance Use Topics  . Alcohol use: No  . Drug use: No     Allergies   Nsaids and  Prednisone   Review of Systems Review of Systems  Unable to perform ROS: Acuity of condition     Physical Exam Updated Vital Signs BP (!) 131/116 (BP Location: Right Arm)   Pulse (!) 0   Temp 97.7 F (36.5 C) (Rectal)   Resp 16   Ht 5\' 6"  (1.676 m)   Wt 80.7 kg (178 lb)   SpO2 90%   BMI 28.73 kg/m   Physical Exam  Constitutional: She appears well-developed and well-nourished.  HENT:  Head: Normocephalic and atraumatic.  Eyes: Right eye exhibits no discharge. Left eye exhibits no discharge.  Cardiovascular: Regular rhythm and normal heart sounds.  No murmur heard. Bradycardic.  Pulmonary/Chest: Effort normal and breath sounds normal. She has no wheezes. She has no rales.  Abdominal: Soft. She exhibits no distension. There is no tenderness.  Edema up to the mid abdomen.  +2 pitting.  Neurological:  Mild confusion.  Skin: She is not diaphoretic.  Cool mottled extremities.  Bilateral lower extremity with bandages.  Right lower extremity shows erythema, improving cellulitis.  Psychiatric: She has a normal mood and affect.  Nursing note and vitals reviewed.    ED Treatments / Results  Labs (all labs ordered are listed, but only abnormal results are displayed) Labs Reviewed  CULTURE, BLOOD (ROUTINE X 2)  CULTURE, BLOOD (ROUTINE X 2)  CBC WITH DIFFERENTIAL/PLATELET  COMPREHENSIVE METABOLIC PANEL  TROPONIN I  BRAIN NATRIURETIC PEPTIDE  URINALYSIS, ROUTINE W REFLEX MICROSCOPIC  I-STAT CG4 LACTIC ACID, ED    EKG  EKG Interpretation None       Radiology No results found.  Procedures Procedures (including critical care time)  CRITICAL CARE Performed by: Arlana Hove Total critical care time: 90 minutes Critical care time was exclusive of separately billable procedures and treating other patients. Critical care was necessary to treat or prevent imminent or life-threatening deterioration. Critical care was time spent personally by me on the  following activities: development of treatment plan with patient and/or surrogate as well as nursing, discussions with consultants, evaluation of patient's response to treatment, examination of patient, obtaining history from patient or surrogate, ordering and performing treatments and interventions, ordering and review of laboratory studies, ordering and review of radiographic studies, pulse oximetry and re-evaluation of patient's condition.   Medications Ordered in ED Medications - No data to display   Initial Impression / Assessment and Plan / ED Course  I have reviewed the triage vital signs and the nursing notes.  Pertinent labs & imaging results that were available during my care of the patient were reviewed by me and considered in my medical decision making (see chart for details).     Patient is an 83 year old female with past medical history significant for recent hospitalization for cellulitis.  Patient has a history of CHF, asthma, anemia, rheumatoid arthritis.  She has chronic A. Fib.  Patient feels "unwell".  Patient's daughter went to go visit her and knows that she did not look well, purple extremities.  Patient recent discharge from hospital with bilateral cellulitis lower lower extremities.  On arrival patient had initially normal vitals however appeared to have poor perfusion with mottled external extremities.   11:44 PM Patient does not appear well on arrival given her mottled.  Extremities.  Patient's vital signs quickly became unstable.  Patient was bradycardic to the 20s.  With edema up to the her abdomen.  Unable to have her get a good waveform on her oxygenation given her mottled cold extremities.  Blood pressure dropped to 80/30.  Will start levophed, give a liter.  Code sepsis called.   I had multiple long conversation with family about the goals of care.  Patient is DNR/DNI.  With initial low heart rate, gave a little bit of atropine.  This improved improved  perfusion a little bit.  Gave fluids.  Discussed with family that she is of likely very poor prognosis given vital signs, lactic of 12.  Patient kept asking to return to her house.  Patient's daughter decided that this is the  most important thing for her.  We went to great efforts to make it possible for her to die at home.  We called [facility] and kept levophed until their arrival.  Stop Levophed and had patient go home.    Had extensive conversations with patient's home and they were able to get medication such as morphine and Ativan to help her pass comfortably.  [facility] unfortunately there is no when here overnight to fax the referral.  However the home that she is going to said that with a written prescription they could call for hospice in the morning.  In the case that she is still alive in the morning.  Final Clinical Impressions(s) / ED Diagnoses   Final diagnoses:  None    ED Discharge Orders    None       Abelino DerrickMackuen, Dorella Laster Lyn, MD 01/02/17 [id number]    Abelino DerrickMackuen, Lataja Newland Lyn, MD 01/02/17 [phone]

## 2016-12-31 NOTE — ED Notes (Signed)
Troponin 0.08, results given to ED MD

## 2016-12-31 NOTE — ED Notes (Signed)
ED Provider at bedside. 

## 2016-12-31 NOTE — ED Notes (Signed)
Priest at bedside.

## 2016-12-31 NOTE — ED Notes (Addendum)
Spoke with Dineashia at the [facility]isted Living Facility. Discussed pt request to come home[facility] states they are able to give oral medications to keep her comfortable.

## 2016-12-31 NOTE — ED Notes (Signed)
Family at bedside. 

## 2016-12-31 NOTE — ED Triage Notes (Signed)
Daughter reports that she visited the pt yesterday and she told her she"just didn't feel well" Today, the NH called the daughter at [phone] and told her she was just not herself. Daughter reports that pt has been like this before when she had the flu, a heart attack and a UTI. Pt responds to questioning and follows commands. Color pale. Skin cool. Extremities and lips slightly cyanotic. EDP, respiratory to bedside.

## 2016-12-31 NOTE — ED Notes (Signed)
Pt helped into gown and placed on cardiac monitor

## 2016-12-31 NOTE — Discharge Instructions (Signed)
We have given a perscription for hospice.  Please call this phone number if you have any issues getting hospice.

## 2016-12-31 NOTE — ED Notes (Signed)
Given brush to brush hair. Family confort measures. Dr. Corlis LeakMackuen updated family.

## 2017-01-02 ENCOUNTER — Telehealth (HOSPITAL_BASED_OUTPATIENT_CLINIC_OR_DEPARTMENT_OTHER): Payer: Self-pay | Admitting: Emergency Medicine

## 2017-01-02 LAB — BLOOD CULTURE ID PANEL (REFLEXED)
ACINETOBACTER BAUMANNII: NOT DETECTED
CANDIDA ALBICANS: NOT DETECTED
CANDIDA GLABRATA: NOT DETECTED
CANDIDA PARAPSILOSIS: NOT DETECTED
CANDIDA TROPICALIS: NOT DETECTED
Candida krusei: NOT DETECTED
Carbapenem resistance: NOT DETECTED
ENTEROBACTER CLOACAE COMPLEX: NOT DETECTED
ENTEROBACTERIACEAE SPECIES: NOT DETECTED
ESCHERICHIA COLI: NOT DETECTED
Enterococcus species: NOT DETECTED
HAEMOPHILUS INFLUENZAE: NOT DETECTED
KLEBSIELLA PNEUMONIAE: NOT DETECTED
Klebsiella oxytoca: NOT DETECTED
Listeria monocytogenes: NOT DETECTED
Neisseria meningitidis: NOT DETECTED
PROTEUS SPECIES: NOT DETECTED
PSEUDOMONAS AERUGINOSA: DETECTED — AB
STREPTOCOCCUS AGALACTIAE: NOT DETECTED
STREPTOCOCCUS PYOGENES: NOT DETECTED
STREPTOCOCCUS SPECIES: NOT DETECTED
Serratia marcescens: NOT DETECTED
Staphylococcus aureus (BCID): NOT DETECTED
Staphylococcus species: NOT DETECTED
Streptococcus pneumoniae: NOT DETECTED

## 2017-01-03 ENCOUNTER — Ambulatory Visit: Payer: Medicare Other | Admitting: Adult Health

## 2017-01-05 LAB — CULTURE, BLOOD (ROUTINE X 2)
SPECIAL REQUESTS: ADEQUATE
SPECIAL REQUESTS: ADEQUATE

## 2017-01-06 ENCOUNTER — Telehealth: Payer: Self-pay

## 2017-01-06 NOTE — Progress Notes (Signed)
ED Antimicrobial Stewardship Positive Culture Follow Up   Sheila LarocheBeverly Barnes is an 83 y.o. female who [facility] with a chief complaint of  Chief Complaint  Patient presents with  . Weakness    Recent Results (from the past 720 hour(s))  Blood culture (routine x 2)     Status: Abnormal   Collection Time: 12/25/2016  3:40 PM  Result Value Ref Range Status   Specimen Description BLOOD LEFT ANTECUBITAL  Final   Special Requests IN PEDIATRIC BOTTLE Blood Culture adequate volume  Final   Culture  Setup Time   Final    GRAM NEGATIVE RODS IN PEDIATRIC BOTTLE CRITICAL VALUE NOTED.  VALUE IS CONSISTENT WITH PREVIOUSLY REPORTED AND CALLED VALUE.    Culture (A)  Final    PSEUDOMONAS AERUGINOSA SUSCEPTIBILITIES PERFORMED ON PREVIOUS CULTURE WITHIN THE LAST 5 DAYS. Performed at [facility], [address] [place], [place] [address]    Report Status 01/05/2017 FINAL  Final  Blood culture (routine x 2)     Status: Abnormal   Collection Time: 12/27/2016  3:55 PM  Result Value Ref Range Status   Specimen Description BLOOD RIGHT ANTECUBITAL  Final   Special Requests   Final    BOTTLES DRAWN AEROBIC AND ANAEROBIC Blood Culture adequate volume   Culture  Setup Time   Final    GRAM NEGATIVE RODS AEROBIC BOTTLE ONLY CRITICAL RESULT CALLED TO, READ BACK BY AND VERIFIED WITH: Thresa RossM. Simms RN 12:40 01/02/17 ([email or web address]) Performed at [facility], [address] [place], [place] [address]    Culture PSEUDOMONAS AERUGINOSA (A)  Final   Report Status 01/05/2017 FINAL  Final   Organism ID, Bacteria PSEUDOMONAS AERUGINOSA  Final      Susceptibility   Pseudomonas aeruginosa - MIC*    CEFTAZIDIME 4 SENSITIVE Sensitive     CIPROFLOXACIN <=0.25 SENSITIVE Sensitive     GENTAMICIN 8 INTERMEDIATE Intermediate     IMIPENEM 2 SENSITIVE Sensitive     PIP/TAZO 16 SENSITIVE Sensitive     CEFEPIME 4 SENSITIVE Sensitive     * PSEUDOMONAS AERUGINOSA  Blood Culture ID Panel (Reflexed)      Status: Abnormal   Collection Time: 12/22/2016  3:55 PM  Result Value Ref Range Status   Enterococcus species NOT DETECTED NOT DETECTED Final   Listeria monocytogenes NOT DETECTED NOT DETECTED Final   Staphylococcus species NOT DETECTED NOT DETECTED Final   Staphylococcus aureus NOT DETECTED NOT DETECTED Final   Streptococcus species NOT DETECTED NOT DETECTED Final   Streptococcus agalactiae NOT DETECTED NOT DETECTED Final   Streptococcus pneumoniae NOT DETECTED NOT DETECTED Final   Streptococcus pyogenes NOT DETECTED NOT DETECTED Final   Acinetobacter baumannii NOT DETECTED NOT DETECTED Final   Enterobacteriaceae species NOT DETECTED NOT DETECTED Final   Enterobacter cloacae complex NOT DETECTED NOT DETECTED Final   Escherichia coli NOT DETECTED NOT DETECTED Final   Klebsiella oxytoca NOT DETECTED NOT DETECTED Final   Klebsiella pneumoniae NOT DETECTED NOT DETECTED Final   Proteus species NOT DETECTED NOT DETECTED Final   Serratia marcescens NOT DETECTED NOT DETECTED Final   Carbapenem resistance NOT DETECTED NOT DETECTED Final   Haemophilus influenzae NOT DETECTED NOT DETECTED Final   Neisseria meningitidis NOT DETECTED NOT DETECTED Final   Pseudomonas aeruginosa DETECTED (A) NOT DETECTED Final    Comment: CRITICAL RESULT CALLED TO, READ BACK BY AND VERIFIED WITH: Thresa RossM. Simms RN 12:40 01/02/17 ([email or web address])    Candida albicans NOT DETECTED NOT  DETECTED Final   Candida glabrata NOT DETECTED NOT DETECTED Final   Candida krusei NOT DETECTED NOT DETECTED Final   Candida parapsilosis NOT DETECTED NOT DETECTED Final   Candida tropicalis NOT DETECTED NOT DETECTED Final    []  Treated with N/A, organism resistant to prescribed antimicrobial [x]  Patient discharged originally without antimicrobial agent and treatment is now indicated  New antibiotic prescription: Pt sent home on hospice, offer patient the option of returning to the hospital to be treated with IV antibiotics  ED Provider:  Lyndel SafeElizabeth Hammond   Brayam Boeke, Drake LeachRachel Lynn 01/06/2017, 10:39 AM Clinical Pharmacist Phone# [phone]

## 2017-01-06 NOTE — Telephone Encounter (Signed)
Pt with+ BC from ED 01/09/2017. Called [facility] and was told that Pt had expired.

## 2017-01-16 DEATH — deceased

## 2019-04-24 IMAGING — CT CT KNEE*R* W/O CM
3 series · 14 of 36 positions shown, 16 images · non-contrast
Comparison: 12/17/2016 tibia and fibula radiographs.

CLINICAL DATA: 89-year-old female fell [REDACTED] and presents with
right knee pain and swelling.

EXAM:
CT OF THE RIGHT KNEE WITHOUT CONTRAST
TECHNIQUE: Multidetector CT imaging of the RIGHT knee was performed according
to the standard protocol. Multiplanar CT image reconstructions were
also generated.

[Series 8: extremity soft tissue · axial · 0.31mm/px · z∈[+520,+674]mm · 5 of 111 slices shown, 7 images]
[im 17/111  soft-tissue]
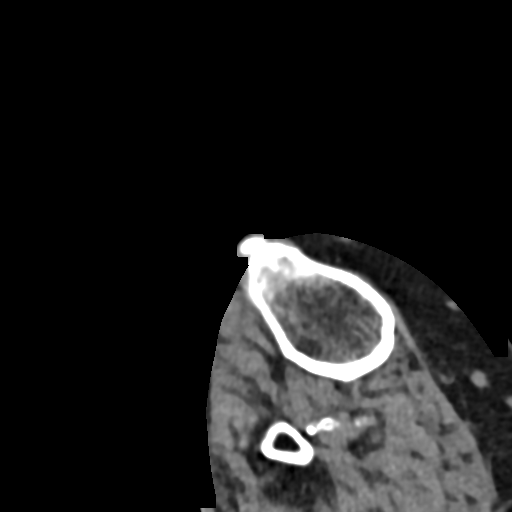
[im 17/111  bone]
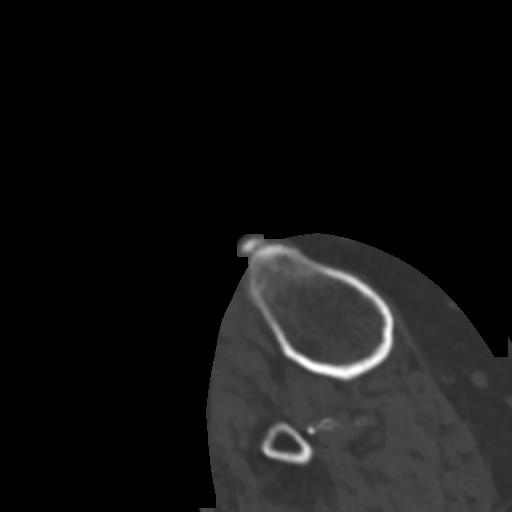
[im 34/111  bone]
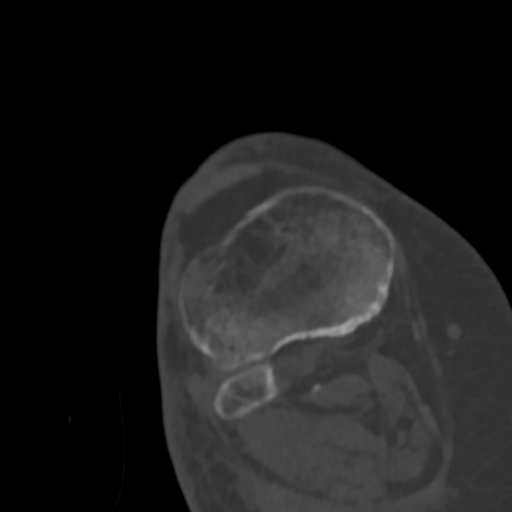
[im 60/111  bone]
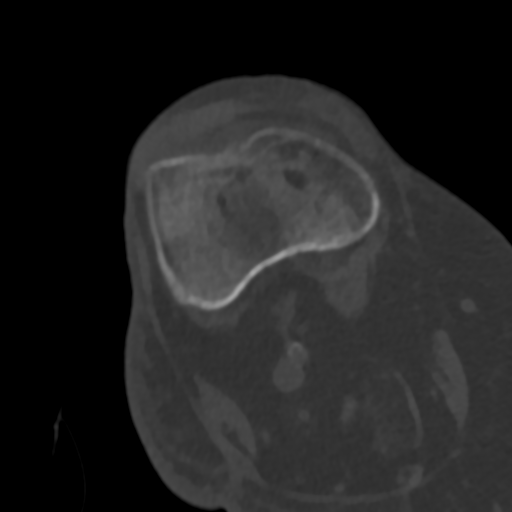
[im 77/111  bone]
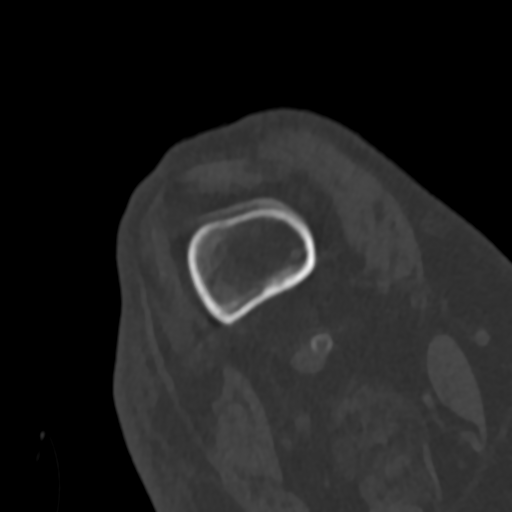
[im 94/111  soft-tissue]
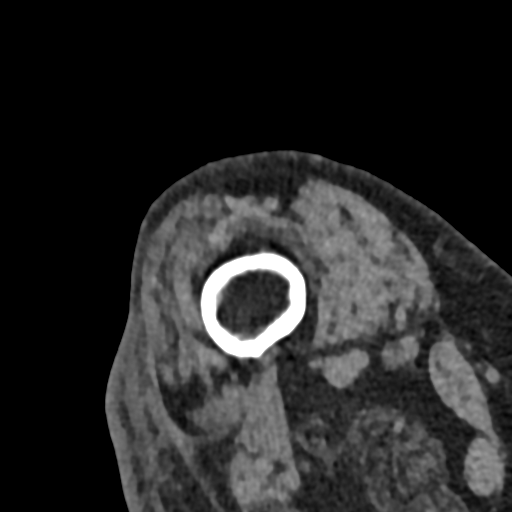
[im 94/111  bone]
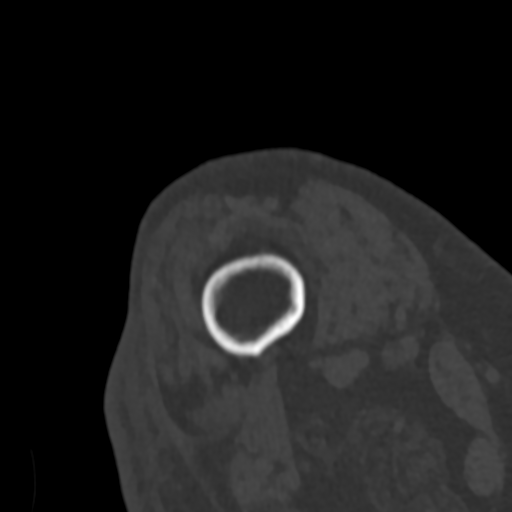

[Series 12: cor soft tissue · coronal · 0.44mm/px · 3 of 104 slices shown]
[im 21/104  bone]
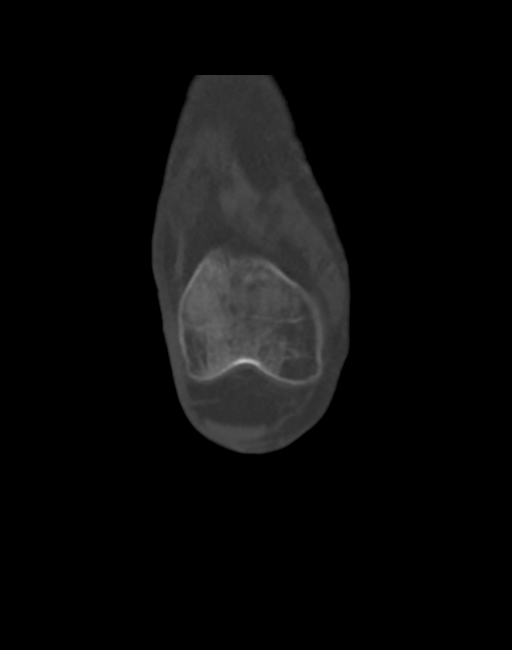
[im 42/104  bone]
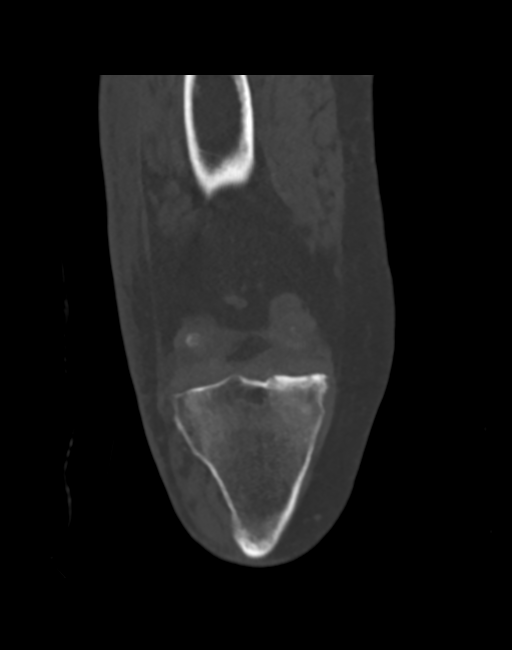
[im 62/104  bone]
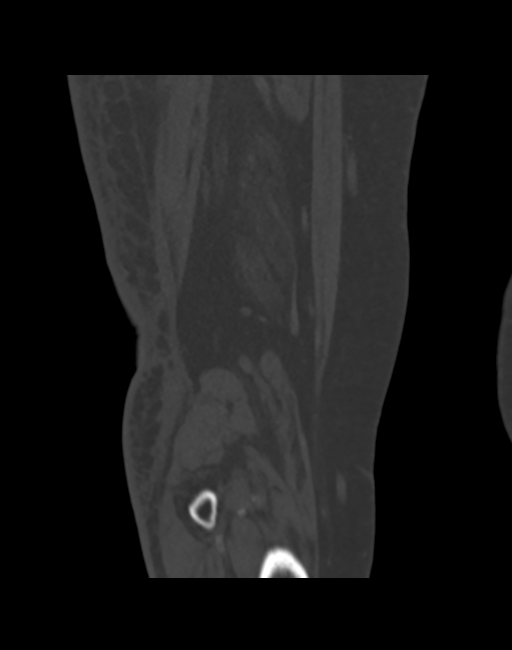

[Series 13: sag soft tissue · sagittal · 0.47mm/px · 6 of 108 slices shown]
[im 36/108  bone]
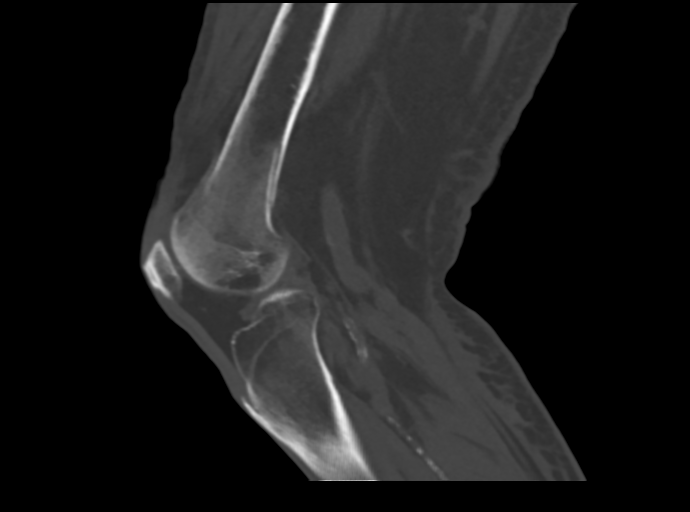
[im 45/108  bone]
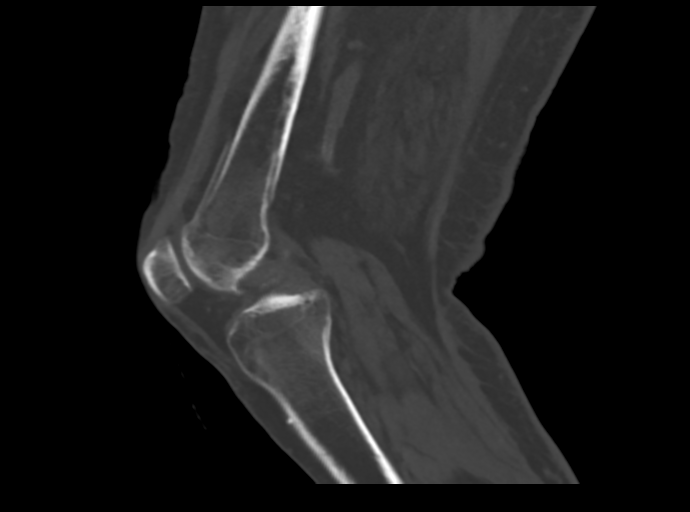
[im 51/108  soft-tissue]
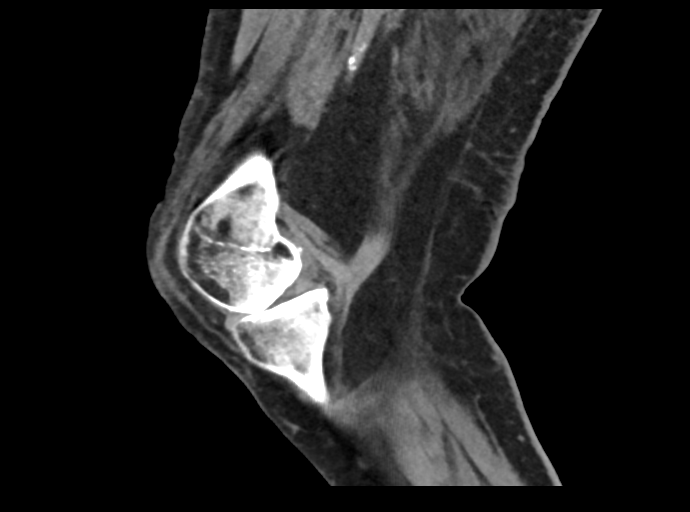
[im 54/108  bone]
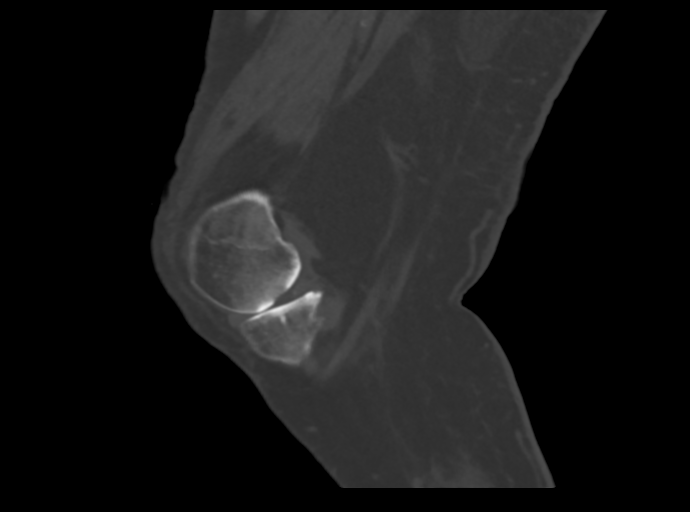
[im 63/108  bone]
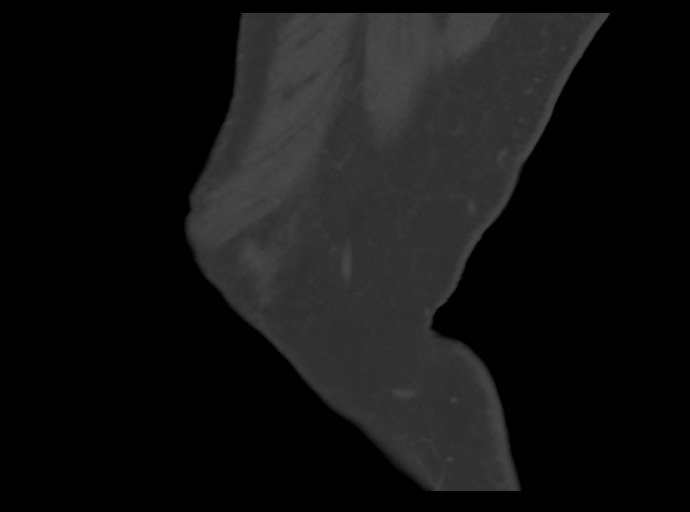
[im 72/108  bone]
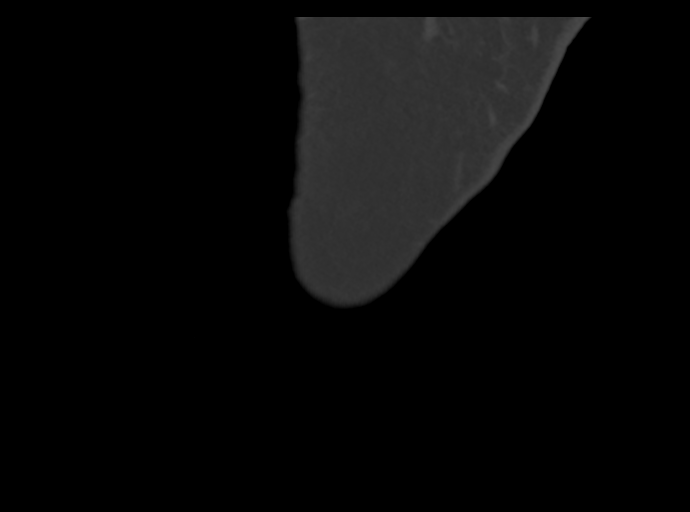

[14 of 36 positions shown; findings below may reference images not displayed]

FINDINGS: Bones/Joint/Cartilage

Motion related artifacts are believed to account for the offset of
the distal femoral metadiaphysis on the sagittal reconstructions. No
apparent fracture is noted on the coronal nor scout images in this
region. If the patient does have pain or focal to the distal femoral
shaft, dedicated femoral radiographs may prove useful.

Osteoarthritic joint space narrowing of the femorotibial and
patellofemoral compartments with subchondral cystic and sclerotic
changes more so along the medial femorotibial compartment. Minimal
spurring is noted of the tibial plateaus. No suspicious osseous
lesions.

No joint effusion.

Ligaments

Suboptimally assessed by CT.

Muscles and Tendons

No intramuscular hemorrhage. Mild generalized intramuscular atrophy.

Soft tissues

Soft tissue contusion along the lateral aspect of the knee.
IMPRESSION: 1. Tricompartmental osteoarthritis of the right knee.
2. No lipohemarthrosis nor findings to suggest an acute fracture.
3. On the sagittal reformatted images, there is offset of the distal
metadiaphyseal cortex of the femur believed to be due to motion
related artifacts. Dedicated femoral radiographs may help confirm
that this is due to motion artifact and not a fracture as clinically
fell warranted.
